# Patient Record
Sex: Female | Born: 1945 | Race: White | Hispanic: No | Marital: Married
Health system: Southern US, Community
[De-identification: ages and names within clinical notes are randomized; demographics above are authoritative.]

---

## 2003-07-16 ENCOUNTER — Other Ambulatory Visit: Payer: Self-pay

## 2003-09-27 ENCOUNTER — Other Ambulatory Visit: Payer: Self-pay

## 2004-08-11 ENCOUNTER — Ambulatory Visit: Payer: Self-pay

## 2004-11-24 ENCOUNTER — Ambulatory Visit: Payer: Self-pay | Admitting: Pain Medicine

## 2004-12-18 ENCOUNTER — Ambulatory Visit: Payer: Self-pay | Admitting: Internal Medicine

## 2005-01-16 ENCOUNTER — Ambulatory Visit: Payer: Self-pay | Admitting: Pain Medicine

## 2005-01-17 ENCOUNTER — Ambulatory Visit: Payer: Self-pay | Admitting: Internal Medicine

## 2005-03-07 ENCOUNTER — Ambulatory Visit: Payer: Self-pay | Admitting: Internal Medicine

## 2005-05-19 ENCOUNTER — Other Ambulatory Visit: Payer: Self-pay

## 2005-05-19 ENCOUNTER — Inpatient Hospital Stay: Payer: Self-pay | Admitting: Internal Medicine

## 2005-05-20 ENCOUNTER — Other Ambulatory Visit: Payer: Self-pay

## 2005-06-02 ENCOUNTER — Ambulatory Visit: Payer: Self-pay | Admitting: Internal Medicine

## 2005-09-16 ENCOUNTER — Inpatient Hospital Stay: Payer: Self-pay | Admitting: Internal Medicine

## 2005-09-21 ENCOUNTER — Emergency Department: Payer: Self-pay | Admitting: Emergency Medicine

## 2005-09-24 ENCOUNTER — Other Ambulatory Visit: Payer: Self-pay

## 2005-09-24 ENCOUNTER — Emergency Department: Payer: Self-pay | Admitting: Emergency Medicine

## 2006-03-01 ENCOUNTER — Inpatient Hospital Stay: Payer: Self-pay | Admitting: Internal Medicine

## 2006-04-23 ENCOUNTER — Ambulatory Visit: Payer: Self-pay | Admitting: Specialist

## 2006-06-01 ENCOUNTER — Ambulatory Visit: Payer: Self-pay | Admitting: Specialist

## 2006-08-25 ENCOUNTER — Inpatient Hospital Stay: Payer: Self-pay | Admitting: Internal Medicine

## 2006-08-25 ENCOUNTER — Other Ambulatory Visit: Payer: Self-pay

## 2006-10-13 ENCOUNTER — Other Ambulatory Visit: Payer: Self-pay

## 2006-10-13 ENCOUNTER — Emergency Department: Payer: Self-pay | Admitting: Emergency Medicine

## 2007-01-16 ENCOUNTER — Emergency Department: Payer: Self-pay | Admitting: Emergency Medicine

## 2007-02-24 ENCOUNTER — Other Ambulatory Visit: Payer: Self-pay

## 2007-02-24 ENCOUNTER — Emergency Department: Payer: Self-pay | Admitting: Emergency Medicine

## 2007-07-23 ENCOUNTER — Ambulatory Visit: Payer: Self-pay

## 2007-07-23 ENCOUNTER — Ambulatory Visit: Payer: Self-pay | Admitting: Specialist

## 2008-09-24 ENCOUNTER — Ambulatory Visit: Payer: Self-pay | Admitting: Specialist

## 2008-10-12 ENCOUNTER — Inpatient Hospital Stay: Payer: Self-pay | Admitting: Internal Medicine

## 2008-11-12 ENCOUNTER — Emergency Department: Payer: Self-pay | Admitting: Emergency Medicine

## 2009-02-23 ENCOUNTER — Ambulatory Visit: Payer: Self-pay | Admitting: Internal Medicine

## 2009-05-19 ENCOUNTER — Inpatient Hospital Stay: Payer: Self-pay | Admitting: Internal Medicine

## 2009-05-23 ENCOUNTER — Encounter: Payer: Self-pay | Admitting: Internal Medicine

## 2009-06-09 ENCOUNTER — Encounter: Payer: Self-pay | Admitting: Internal Medicine

## 2009-07-09 ENCOUNTER — Encounter: Payer: Self-pay | Admitting: Internal Medicine

## 2009-10-24 ENCOUNTER — Inpatient Hospital Stay: Payer: Self-pay | Admitting: Internal Medicine

## 2010-04-28 ENCOUNTER — Ambulatory Visit: Payer: Self-pay | Admitting: Internal Medicine

## 2010-05-16 ENCOUNTER — Ambulatory Visit: Payer: Self-pay | Admitting: Internal Medicine

## 2010-08-16 ENCOUNTER — Ambulatory Visit: Payer: Self-pay | Admitting: Gastroenterology

## 2010-08-17 ENCOUNTER — Ambulatory Visit: Payer: Self-pay | Admitting: Unknown Physician Specialty

## 2011-04-21 ENCOUNTER — Ambulatory Visit: Payer: Self-pay | Admitting: Internal Medicine

## 2011-11-01 ENCOUNTER — Ambulatory Visit: Payer: Self-pay | Admitting: Internal Medicine

## 2011-11-21 ENCOUNTER — Observation Stay: Payer: Self-pay | Admitting: Internal Medicine

## 2011-11-21 LAB — COMPREHENSIVE METABOLIC PANEL
Albumin: 3.2 g/dL — ABNORMAL LOW (ref 3.4–5.0)
Alkaline Phosphatase: 114 U/L (ref 50–136)
Anion Gap: 9 (ref 7–16)
BUN: 18 mg/dL (ref 7–18)
Bilirubin,Total: 0.7 mg/dL (ref 0.2–1.0)
Calcium, Total: 8.5 mg/dL (ref 8.5–10.1)
Creatinine: 0.8 mg/dL (ref 0.60–1.30)
Glucose: 102 mg/dL — ABNORMAL HIGH (ref 65–99)
Osmolality: 280 (ref 275–301)
SGPT (ALT): 10 U/L — ABNORMAL LOW (ref 12–78)

## 2011-11-21 LAB — CBC
HCT: 36.2 % (ref 35.0–47.0)
MCV: 94 fL (ref 80–100)
RBC: 3.85 10*6/uL (ref 3.80–5.20)
RDW: 13.4 % (ref 11.5–14.5)
WBC: 9.7 10*3/uL (ref 3.6–11.0)

## 2011-11-21 LAB — CK TOTAL AND CKMB (NOT AT ARMC): CK, Total: 39 U/L (ref 21–215)

## 2011-11-21 LAB — PROTIME-INR: Prothrombin Time: 13.3 secs (ref 11.5–14.7)

## 2011-11-21 LAB — TROPONIN I: Troponin-I: <0.02 ng/mL

## 2011-11-22 LAB — BASIC METABOLIC PANEL
Anion Gap: 10 (ref 7–16)
BUN: 18 mg/dL (ref 7–18)
Calcium, Total: 9.2 mg/dL (ref 8.5–10.1)
EGFR (Non-African Amer.): >60
Glucose: 133 mg/dL — ABNORMAL HIGH (ref 65–99)
Osmolality: 281 (ref 275–301)
Sodium: 139 mmol/L (ref 136–145)

## 2011-11-22 LAB — CBC WITH DIFFERENTIAL/PLATELET
Basophil %: 0.2 %
Eosinophil %: 0 %
HCT: 34.8 % — ABNORMAL LOW (ref 35.0–47.0)
Lymphocyte #: 0.4 10*3/uL — ABNORMAL LOW (ref 1.0–3.6)
MCH: 32.6 pg (ref 26.0–34.0)
MCHC: 34.8 g/dL (ref 32.0–36.0)
MCV: 94 fL (ref 80–100)
Monocyte #: 0.1 x10 3/mm — ABNORMAL LOW (ref 0.2–0.9)
Monocyte %: 1.4 %
Neutrophil %: 89.5 %
Platelet: 163 10*3/uL (ref 150–440)
RBC: 3.71 10*6/uL — ABNORMAL LOW (ref 3.80–5.20)
WBC: 4.2 10*3/uL (ref 3.6–11.0)

## 2011-11-22 LAB — PROTIME-INR: INR: 0.9

## 2011-11-22 LAB — TROPONIN I: Troponin-I: <0.02 ng/mL

## 2011-11-24 ENCOUNTER — Ambulatory Visit: Payer: Self-pay | Admitting: Internal Medicine

## 2011-12-10 ENCOUNTER — Ambulatory Visit: Payer: Self-pay | Admitting: Internal Medicine

## 2011-12-22 ENCOUNTER — Emergency Department: Payer: Self-pay | Admitting: Emergency Medicine

## 2012-01-05 ENCOUNTER — Inpatient Hospital Stay: Payer: Self-pay | Admitting: Internal Medicine

## 2012-01-05 LAB — PROTIME-INR
INR: 6
Prothrombin Time: 53.1 secs — ABNORMAL HIGH (ref 11.5–14.7)

## 2012-01-05 LAB — COMPREHENSIVE METABOLIC PANEL
Anion Gap: 8 (ref 7–16)
BUN: 30 mg/dL — ABNORMAL HIGH (ref 7–18)
Bilirubin,Total: 0.5 mg/dL (ref 0.2–1.0)
Chloride: 105 mmol/L (ref 98–107)
Creatinine: 1.04 mg/dL (ref 0.60–1.30)
EGFR (African American): >60
Osmolality: 286 (ref 275–301)
Potassium: 4.6 mmol/L (ref 3.5–5.1)
SGOT(AST): 18 U/L (ref 15–37)
SGPT (ALT): <6 U/L — ABNORMAL LOW (ref 12–78)
Sodium: 140 mmol/L (ref 136–145)
Total Protein: 6.2 g/dL — ABNORMAL LOW (ref 6.4–8.2)

## 2012-01-05 LAB — TROPONIN I
Troponin-I: 0.13 ng/mL — ABNORMAL HIGH
Troponin-I: 0.08 ng/mL — ABNORMAL HIGH

## 2012-01-05 LAB — CK TOTAL AND CKMB (NOT AT ARMC)
CK, Total: 165 U/L (ref 21–215)
CK, Total: 134 U/L (ref 21–215)

## 2012-01-05 LAB — CBC
HGB: 9.9 g/dL — ABNORMAL LOW (ref 12.0–16.0)
MCHC: 33.2 g/dL (ref 32.0–36.0)

## 2012-01-06 LAB — CBC WITH DIFFERENTIAL/PLATELET
Basophil %: 0.1 %
Eosinophil #: 0 10*3/uL (ref 0.0–0.7)
Eosinophil %: 0 %
HCT: 26.3 % — ABNORMAL LOW (ref 35.0–47.0)
Lymphocyte #: 0.3 10*3/uL — ABNORMAL LOW (ref 1.0–3.6)
MCHC: 31.3 g/dL — ABNORMAL LOW (ref 32.0–36.0)
MCV: 95 fL (ref 80–100)
Monocyte #: 0.1 x10 3/mm — ABNORMAL LOW (ref 0.2–0.9)
RDW: 14.7 % — ABNORMAL HIGH (ref 11.5–14.5)

## 2012-01-06 LAB — COMPREHENSIVE METABOLIC PANEL
Albumin: 2.3 g/dL — ABNORMAL LOW (ref 3.4–5.0)
Anion Gap: 7 (ref 7–16)
BUN: 21 mg/dL — ABNORMAL HIGH (ref 7–18)
Calcium, Total: 8.5 mg/dL (ref 8.5–10.1)
EGFR (African American): >60
EGFR (Non-African Amer.): >60
Glucose: 189 mg/dL — ABNORMAL HIGH (ref 65–99)
Osmolality: 289 (ref 275–301)
Potassium: 4.8 mmol/L (ref 3.5–5.1)
SGOT(AST): 11 U/L — ABNORMAL LOW (ref 15–37)
SGPT (ALT): 7 U/L — ABNORMAL LOW (ref 12–78)

## 2012-01-06 LAB — PROTIME-INR: Prothrombin Time: 37 secs — ABNORMAL HIGH (ref 11.5–14.7)

## 2012-01-07 LAB — COMPREHENSIVE METABOLIC PANEL
Albumin: 2.4 g/dL — ABNORMAL LOW (ref 3.4–5.0)
Alkaline Phosphatase: 83 U/L (ref 50–136)
BUN: 27 mg/dL — ABNORMAL HIGH (ref 7–18)
Calcium, Total: 8 mg/dL — ABNORMAL LOW (ref 8.5–10.1)
Chloride: 109 mmol/L — ABNORMAL HIGH (ref 98–107)
Co2: 27 mmol/L (ref 21–32)
EGFR (African American): >60
EGFR (Non-African Amer.): >60
Glucose: 135 mg/dL — ABNORMAL HIGH (ref 65–99)
Osmolality: 292 (ref 275–301)
Potassium: 4.8 mmol/L (ref 3.5–5.1)
SGOT(AST): 15 U/L (ref 15–37)
Sodium: 143 mmol/L (ref 136–145)
Total Protein: 5.2 g/dL — ABNORMAL LOW (ref 6.4–8.2)

## 2012-01-07 LAB — CBC WITH DIFFERENTIAL/PLATELET
Basophil #: 0 10*3/uL (ref 0.0–0.1)
Basophil %: 0 %
Eosinophil #: 0 10*3/uL (ref 0.0–0.7)
HCT: 26.5 % — ABNORMAL LOW (ref 35.0–47.0)
Lymphocyte #: 0.4 10*3/uL — ABNORMAL LOW (ref 1.0–3.6)
MCH: 31 pg (ref 26.0–34.0)
MCHC: 33 g/dL (ref 32.0–36.0)
MCV: 94 fL (ref 80–100)
Monocyte #: 0.2 x10 3/mm (ref 0.2–0.9)
Neutrophil #: 4.9 10*3/uL (ref 1.4–6.5)
RDW: 14.8 % — ABNORMAL HIGH (ref 11.5–14.5)

## 2012-01-07 LAB — PROTIME-INR
INR: 2.5
Prothrombin Time: 27 secs — ABNORMAL HIGH (ref 11.5–14.7)

## 2012-01-08 LAB — BASIC METABOLIC PANEL
Calcium, Total: 7.8 mg/dL — ABNORMAL LOW (ref 8.5–10.1)
Chloride: 107 mmol/L (ref 98–107)
Co2: 28 mmol/L (ref 21–32)
Creatinine: 0.73 mg/dL (ref 0.60–1.30)
EGFR (African American): >60
Potassium: 4.7 mmol/L (ref 3.5–5.1)
Sodium: 140 mmol/L (ref 136–145)

## 2012-01-08 LAB — CBC WITH DIFFERENTIAL/PLATELET
Basophil %: 0.3 %
Eosinophil %: 0.1 %
HGB: 9.4 g/dL — ABNORMAL LOW (ref 12.0–16.0)
Lymphocyte #: 0.4 10*3/uL — ABNORMAL LOW (ref 1.0–3.6)
Lymphocyte %: 7.2 %
MCV: 94 fL (ref 80–100)
Monocyte #: 0.1 x10 3/mm — ABNORMAL LOW (ref 0.2–0.9)
Monocyte %: 2.7 %
Neutrophil #: 4.6 10*3/uL (ref 1.4–6.5)
Platelet: 158 10*3/uL (ref 150–440)
RBC: 2.94 10*6/uL — ABNORMAL LOW (ref 3.80–5.20)
RDW: 14.5 % (ref 11.5–14.5)
WBC: 5.1 10*3/uL (ref 3.6–11.0)

## 2012-01-08 LAB — PROTIME-INR
INR: 2
Prothrombin Time: 23.3 secs — ABNORMAL HIGH (ref 11.5–14.7)

## 2012-01-09 LAB — PROTIME-INR
INR: 2.4
Prothrombin Time: 26.6 secs — ABNORMAL HIGH (ref 11.5–14.7)

## 2012-01-09 LAB — BASIC METABOLIC PANEL
Calcium, Total: 7.6 mg/dL — ABNORMAL LOW (ref 8.5–10.1)
Chloride: 109 mmol/L — ABNORMAL HIGH (ref 98–107)
Osmolality: 285 (ref 275–301)
Potassium: 4.5 mmol/L (ref 3.5–5.1)

## 2012-01-10 LAB — PROTIME-INR
INR: 2.4
Prothrombin Time: 26.3 s — ABNORMAL HIGH (ref 11.5–14.7)

## 2012-01-11 LAB — PROTIME-INR
INR: 2.4
Prothrombin Time: 26.5 secs — ABNORMAL HIGH (ref 11.5–14.7)

## 2012-01-11 LAB — CBC WITH DIFFERENTIAL/PLATELET
HCT: 25.2 % — ABNORMAL LOW (ref 35.0–47.0)
HGB: 8.6 g/dL — ABNORMAL LOW (ref 12.0–16.0)
Lymphocytes: 17 %
MCH: 31.5 pg (ref 26.0–34.0)
MCHC: 34.2 g/dL (ref 32.0–36.0)
MCV: 92 fL (ref 80–100)
Segmented Neutrophils: 2 %

## 2012-01-11 LAB — CULTURE, BLOOD (SINGLE)

## 2012-04-17 ENCOUNTER — Ambulatory Visit: Payer: Self-pay | Admitting: Specialist

## 2012-05-20 ENCOUNTER — Ambulatory Visit: Payer: Self-pay | Admitting: Gastroenterology

## 2012-07-17 LAB — PROTIME-INR
INR: 2.6
Prothrombin Time: 27 secs — ABNORMAL HIGH (ref 11.5–14.7)

## 2012-07-17 LAB — URINALYSIS, COMPLETE
Bilirubin,UR: NEGATIVE
Glucose,UR: NEGATIVE mg/dL (ref 0–75)
Nitrite: POSITIVE
Ph: 5 (ref 4.5–8.0)
Protein: NEGATIVE
RBC,UR: 89 /HPF (ref 0–5)
Specific Gravity: 1.02 (ref 1.003–1.030)
Squamous Epithelial: <1
WBC UR: 35 /HPF (ref 0–5)

## 2012-07-17 LAB — TROPONIN I: Troponin-I: <0.02 ng/mL

## 2012-07-17 LAB — CBC
HCT: 33.3 % — ABNORMAL LOW (ref 35.0–47.0)
HGB: 11.3 g/dL — ABNORMAL LOW (ref 12.0–16.0)
RBC: 3.4 10*6/uL — ABNORMAL LOW (ref 3.80–5.20)
RDW: 15.1 % — ABNORMAL HIGH (ref 11.5–14.5)
WBC: 5.1 10*3/uL (ref 3.6–11.0)

## 2012-07-17 LAB — COMPREHENSIVE METABOLIC PANEL
Albumin: 2.6 g/dL — ABNORMAL LOW (ref 3.4–5.0)
Alkaline Phosphatase: 82 U/L (ref 50–136)
Calcium, Total: 7.8 mg/dL — ABNORMAL LOW (ref 8.5–10.1)
Creatinine: 1.28 mg/dL (ref 0.60–1.30)
EGFR (African American): 50 — ABNORMAL LOW
EGFR (Non-African Amer.): 44 — ABNORMAL LOW
Glucose: 89 mg/dL (ref 65–99)
SGOT(AST): 22 U/L (ref 15–37)
SGPT (ALT): <6 U/L — ABNORMAL LOW (ref 12–78)
Sodium: 137 mmol/L (ref 136–145)
Total Protein: 5.3 g/dL — ABNORMAL LOW (ref 6.4–8.2)

## 2012-07-17 LAB — CK TOTAL AND CKMB (NOT AT ARMC): CK-MB: 1 ng/mL (ref 0.5–3.6)

## 2012-07-17 LAB — APTT: Activated PTT: 35.4 secs (ref 23.6–35.9)

## 2012-07-18 ENCOUNTER — Observation Stay: Payer: Self-pay | Admitting: Internal Medicine

## 2012-07-18 LAB — CK TOTAL AND CKMB (NOT AT ARMC)
CK, Total: 37 U/L (ref 21–215)
CK, Total: 43 U/L (ref 21–215)
CK-MB: 0.7 ng/mL (ref 0.5–3.6)

## 2012-07-18 LAB — TROPONIN I
Troponin-I: <0.02 ng/mL
Troponin-I: <0.02 ng/mL

## 2012-07-19 LAB — CBC WITH DIFFERENTIAL/PLATELET
Basophil %: 0.6 %
Eosinophil #: 0 10*3/uL (ref 0.0–0.7)
Eosinophil %: 1.6 %
HCT: 29.6 % — ABNORMAL LOW (ref 35.0–47.0)
HGB: 10.1 g/dL — ABNORMAL LOW (ref 12.0–16.0)
Lymphocyte #: 0.9 10*3/uL — ABNORMAL LOW (ref 1.0–3.6)
MCH: 33.4 pg (ref 26.0–34.0)
MCHC: 34.1 g/dL (ref 32.0–36.0)
Monocyte #: 0.3 x10 3/mm (ref 0.2–0.9)
Monocyte %: 10.6 %
Neutrophil #: 1.6 10*3/uL (ref 1.4–6.5)
Platelet: 124 10*3/uL — ABNORMAL LOW (ref 150–440)
RDW: 15.3 % — ABNORMAL HIGH (ref 11.5–14.5)
WBC: 2.8 10*3/uL — ABNORMAL LOW (ref 3.6–11.0)

## 2012-07-19 LAB — BASIC METABOLIC PANEL
Anion Gap: 3 — ABNORMAL LOW (ref 7–16)
Calcium, Total: 8 mg/dL — ABNORMAL LOW (ref 8.5–10.1)
Creatinine: 0.9 mg/dL (ref 0.60–1.30)
EGFR (African American): >60
Glucose: 96 mg/dL (ref 65–99)
Osmolality: 285 (ref 275–301)
Potassium: 4 mmol/L (ref 3.5–5.1)
Sodium: 142 mmol/L (ref 136–145)

## 2012-07-19 LAB — PROTIME-INR
INR: 2.4
Prothrombin Time: 25.7 secs — ABNORMAL HIGH (ref 11.5–14.7)

## 2012-08-26 ENCOUNTER — Other Ambulatory Visit: Payer: Self-pay | Admitting: Gastroenterology

## 2012-09-02 ENCOUNTER — Encounter: Payer: Self-pay | Admitting: Neurology

## 2012-09-16 ENCOUNTER — Encounter: Payer: Self-pay | Admitting: Neurology

## 2012-09-20 ENCOUNTER — Other Ambulatory Visit: Payer: Self-pay | Admitting: Gastroenterology

## 2012-09-20 LAB — CLOSTRIDIUM DIFFICILE BY PCR

## 2012-10-02 ENCOUNTER — Inpatient Hospital Stay: Payer: Self-pay | Admitting: Internal Medicine

## 2012-10-02 LAB — COMPREHENSIVE METABOLIC PANEL
BUN: 19 mg/dL — ABNORMAL HIGH (ref 7–18)
Calcium, Total: 8.6 mg/dL (ref 8.5–10.1)
Co2: 31 mmol/L (ref 21–32)
Creatinine: 1.08 mg/dL (ref 0.60–1.30)
Sodium: 137 mmol/L (ref 136–145)
Total Protein: 6 g/dL — ABNORMAL LOW (ref 6.4–8.2)

## 2012-10-02 LAB — URINALYSIS, COMPLETE
Bilirubin,UR: NEGATIVE
Blood: NEGATIVE
Leukocyte Esterase: NEGATIVE
Nitrite: NEGATIVE
Protein: NEGATIVE
RBC,UR: <1 /HPF (ref 0–5)
Specific Gravity: 1.004 (ref 1.003–1.030)
WBC UR: 3 /HPF (ref 0–5)

## 2012-10-02 LAB — CBC
HCT: 35.6 % (ref 35.0–47.0)
MCH: 32.8 pg (ref 26.0–34.0)
MCHC: 34 g/dL (ref 32.0–36.0)
RBC: 3.69 10*6/uL — ABNORMAL LOW (ref 3.80–5.20)
WBC: 7.4 10*3/uL (ref 3.6–11.0)

## 2012-10-02 LAB — PROTIME-INR
INR: 2
Prothrombin Time: 22.1 secs — ABNORMAL HIGH (ref 11.5–14.7)

## 2012-10-03 LAB — BASIC METABOLIC PANEL
Anion Gap: 5 — ABNORMAL LOW (ref 7–16)
BUN: 19 mg/dL — ABNORMAL HIGH (ref 7–18)
Chloride: 105 mmol/L (ref 98–107)
Creatinine: 1.14 mg/dL (ref 0.60–1.30)
EGFR (African American): 58 — ABNORMAL LOW
EGFR (Non-African Amer.): 50 — ABNORMAL LOW
Glucose: 78 mg/dL (ref 65–99)
Osmolality: 279 (ref 275–301)
Sodium: 139 mmol/L (ref 136–145)

## 2012-10-03 LAB — CBC WITH DIFFERENTIAL/PLATELET
Basophil #: 0 10*3/uL (ref 0.0–0.1)
Eosinophil #: 0 10*3/uL (ref 0.0–0.7)
HCT: 26.5 % — ABNORMAL LOW (ref 35.0–47.0)
Lymphocyte #: 1 10*3/uL (ref 1.0–3.6)
Lymphocyte %: 26.4 %
MCV: 97 fL (ref 80–100)
Monocyte %: 5.4 %
Neutrophil #: 2.6 10*3/uL (ref 1.4–6.5)
Neutrophil %: 66.8 %
Platelet: 134 10*3/uL — ABNORMAL LOW (ref 150–440)
RBC: 2.73 10*6/uL — ABNORMAL LOW (ref 3.80–5.20)
WBC: 3.9 10*3/uL (ref 3.6–11.0)

## 2012-10-03 LAB — PROTIME-INR
INR: 2
Prothrombin Time: 22.3 secs — ABNORMAL HIGH (ref 11.5–14.7)

## 2012-10-03 LAB — FERRITIN: Ferritin (ARMC): 209 ng/mL (ref 8–388)

## 2012-10-03 LAB — IRON AND TIBC
Iron Bind.Cap.(Total): 136 ug/dL — ABNORMAL LOW (ref 250–450)
Iron Saturation: 46 %
Iron: 62 ug/dL (ref 50–170)

## 2012-10-04 LAB — COMPREHENSIVE METABOLIC PANEL
Albumin: 2.2 g/dL — ABNORMAL LOW (ref 3.4–5.0)
BUN: 17 mg/dL (ref 7–18)
Bilirubin,Total: 0.4 mg/dL (ref 0.2–1.0)
Chloride: 106 mmol/L (ref 98–107)
Co2: 31 mmol/L (ref 21–32)
EGFR (African American): >60
Glucose: 79 mg/dL (ref 65–99)
Osmolality: 280 (ref 275–301)
SGOT(AST): 16 U/L (ref 15–37)
Total Protein: 4.7 g/dL — ABNORMAL LOW (ref 6.4–8.2)

## 2012-10-04 LAB — CBC WITH DIFFERENTIAL/PLATELET
Basophil #: 0 10*3/uL (ref 0.0–0.1)
Eosinophil #: 0 10*3/uL (ref 0.0–0.7)
HCT: 28.4 % — ABNORMAL LOW (ref 35.0–47.0)
Lymphocyte #: 0.7 10*3/uL — ABNORMAL LOW (ref 1.0–3.6)
MCH: 33.4 pg (ref 26.0–34.0)
MCV: 98 fL (ref 80–100)
Monocyte #: 0.3 x10 3/mm (ref 0.2–0.9)
Monocyte %: 6 %
Neutrophil #: 4.7 10*3/uL (ref 1.4–6.5)
Neutrophil %: 81.7 %
Platelet: 137 10*3/uL — ABNORMAL LOW (ref 150–440)
RBC: 2.91 10*6/uL — ABNORMAL LOW (ref 3.80–5.20)

## 2012-10-04 LAB — PROTIME-INR
INR: 2.4
Prothrombin Time: 25.6 secs — ABNORMAL HIGH (ref 11.5–14.7)

## 2012-10-09 ENCOUNTER — Encounter: Payer: Self-pay | Admitting: Neurology

## 2012-11-07 ENCOUNTER — Ambulatory Visit: Payer: Self-pay | Admitting: Specialist

## 2012-11-21 ENCOUNTER — Ambulatory Visit: Payer: Self-pay | Admitting: Hospice and Palliative Medicine

## 2012-11-21 ENCOUNTER — Inpatient Hospital Stay: Payer: Self-pay | Admitting: Internal Medicine

## 2012-11-21 LAB — PROTIME-INR
INR: 1.4
Prothrombin Time: 17.1 secs — ABNORMAL HIGH (ref 11.5–14.7)

## 2012-11-21 LAB — BASIC METABOLIC PANEL
Anion Gap: 7 (ref 7–16)
BUN: 18 mg/dL (ref 7–18)
Chloride: 105 mmol/L (ref 98–107)
Co2: 25 mmol/L (ref 21–32)
EGFR (African American): >60
EGFR (Non-African Amer.): >60
Glucose: 97 mg/dL (ref 65–99)
Osmolality: 276 (ref 275–301)
Sodium: 137 mmol/L (ref 136–145)

## 2012-11-21 LAB — CK TOTAL AND CKMB (NOT AT ARMC)
CK, Total: 67 U/L (ref 21–215)
CK-MB: <0.5 ng/mL — ABNORMAL LOW (ref 0.5–3.6)

## 2012-11-21 LAB — TROPONIN I: Troponin-I: <0.02 ng/mL

## 2012-11-21 LAB — CBC
HGB: 9.6 g/dL — ABNORMAL LOW (ref 12.0–16.0)
MCH: 35.1 pg — ABNORMAL HIGH (ref 26.0–34.0)
MCV: 104 fL — ABNORMAL HIGH (ref 80–100)
Platelet: 184 10*3/uL (ref 150–440)
RDW: 14.9 % — ABNORMAL HIGH (ref 11.5–14.5)

## 2012-11-22 LAB — BASIC METABOLIC PANEL
BUN: 19 mg/dL — ABNORMAL HIGH (ref 7–18)
Calcium, Total: 8.3 mg/dL — ABNORMAL LOW (ref 8.5–10.1)
Chloride: 107 mmol/L (ref 98–107)
Co2: 25 mmol/L (ref 21–32)
Creatinine: 0.73 mg/dL (ref 0.60–1.30)
Osmolality: 279 (ref 275–301)
Sodium: 137 mmol/L (ref 136–145)

## 2012-11-22 LAB — CBC WITH DIFFERENTIAL/PLATELET
Basophil #: 0 10*3/uL (ref 0.0–0.1)
Basophil %: 0.1 %
Eosinophil #: 0 10*3/uL (ref 0.0–0.7)
HGB: 9 g/dL — ABNORMAL LOW (ref 12.0–16.0)
Lymphocyte %: 9.8 %
Monocyte %: 1.3 %
Neutrophil #: 3.6 10*3/uL (ref 1.4–6.5)
Platelet: 178 10*3/uL (ref 150–440)
RBC: 2.54 10*6/uL — ABNORMAL LOW (ref 3.80–5.20)
RDW: 14.3 % (ref 11.5–14.5)
WBC: 4.1 10*3/uL (ref 3.6–11.0)

## 2012-11-22 LAB — PROTIME-INR: INR: 1.9

## 2012-11-23 LAB — CBC WITH DIFFERENTIAL/PLATELET
Basophil #: 0 10*3/uL (ref 0.0–0.1)
Eosinophil #: 0 10*3/uL (ref 0.0–0.7)
Eosinophil %: 0.1 %
HGB: 9.7 g/dL — ABNORMAL LOW (ref 12.0–16.0)
Lymphocyte %: 9.8 %
MCH: 35.5 pg — ABNORMAL HIGH (ref 26.0–34.0)
MCV: 106 fL — ABNORMAL HIGH (ref 80–100)
Monocyte #: 0.1 x10 3/mm — ABNORMAL LOW (ref 0.2–0.9)
Neutrophil %: 88.3 %
Platelet: 227 10*3/uL (ref 150–440)
WBC: 5.8 10*3/uL (ref 3.6–11.0)

## 2012-11-23 LAB — BASIC METABOLIC PANEL
Anion Gap: 4 — ABNORMAL LOW (ref 7–16)
BUN: 21 mg/dL — ABNORMAL HIGH (ref 7–18)
Calcium, Total: 8.5 mg/dL (ref 8.5–10.1)
Chloride: 108 mmol/L — ABNORMAL HIGH (ref 98–107)
Co2: 27 mmol/L (ref 21–32)
Creatinine: 0.99 mg/dL (ref 0.60–1.30)
EGFR (African American): >60
EGFR (Non-African Amer.): 59 — ABNORMAL LOW
Glucose: 117 mg/dL — ABNORMAL HIGH (ref 65–99)
Osmolality: 282 (ref 275–301)
Sodium: 139 mmol/L (ref 136–145)

## 2012-11-23 LAB — PROTIME-INR: Prothrombin Time: 21.2 secs — ABNORMAL HIGH (ref 11.5–14.7)

## 2012-11-24 LAB — PROTIME-INR: INR: 2.2

## 2012-11-25 LAB — PROTIME-INR: Prothrombin Time: 27.5 secs — ABNORMAL HIGH (ref 11.5–14.7)

## 2012-11-26 LAB — CULTURE, BLOOD (SINGLE)

## 2012-12-09 ENCOUNTER — Ambulatory Visit: Payer: Self-pay | Admitting: Hospice and Palliative Medicine

## 2013-02-22 ENCOUNTER — Inpatient Hospital Stay: Payer: Self-pay | Admitting: Internal Medicine

## 2013-02-22 LAB — COMPREHENSIVE METABOLIC PANEL WITH GFR
Albumin: 1.9 g/dL — ABNORMAL LOW
Alkaline Phosphatase: 74 U/L
Anion Gap: 5 — ABNORMAL LOW
BUN: 16 mg/dL
Bilirubin,Total: 0.8 mg/dL
Calcium, Total: 7.9 mg/dL — ABNORMAL LOW
Chloride: 108 mmol/L — ABNORMAL HIGH
Co2: 28 mmol/L
Creatinine: 0.79 mg/dL
EGFR (African American): >60
EGFR (Non-African Amer.): >60
Glucose: 84 mg/dL
Osmolality: 282
Potassium: 4.2 mmol/L
SGOT(AST): 23 U/L
SGPT (ALT): <6 U/L — ABNORMAL LOW
Sodium: 141 mmol/L
Total Protein: 4.4 g/dL — ABNORMAL LOW

## 2013-02-22 LAB — URINALYSIS, COMPLETE
Bilirubin,UR: NEGATIVE
GLUCOSE, UR: NEGATIVE mg/dL (ref 0–75)
Leukocyte Esterase: NEGATIVE
Nitrite: NEGATIVE
Ph: 5 (ref 4.5–8.0)
Protein: 30
RBC,UR: 81 /HPF (ref 0–5)
Specific Gravity: 1.027 (ref 1.003–1.030)
Squamous Epithelial: <1
WBC UR: 8 /HPF (ref 0–5)

## 2013-02-22 LAB — CBC
HCT: 25.4 % — ABNORMAL LOW
HGB: 8.7 g/dL — ABNORMAL LOW
MCH: 37.6 pg — ABNORMAL HIGH
MCHC: 34.2 g/dL
MCV: 110 fL — ABNORMAL HIGH
Platelet: 172 x10 3/mm 3
RBC: 2.31 X10 6/mm 3 — ABNORMAL LOW
RDW: 15.1 % — ABNORMAL HIGH
WBC: 3.7 x10 3/mm 3

## 2013-02-22 LAB — PROTIME-INR
INR: 4.8
Prothrombin Time: 43.3 s — ABNORMAL HIGH

## 2013-02-22 LAB — TROPONIN I: Troponin-I: <0.02 ng/mL

## 2013-02-23 LAB — CBC WITH DIFFERENTIAL/PLATELET
BASOS PCT: 0.6 %
Basophil #: 0 10*3/uL (ref 0.0–0.1)
EOS ABS: 0 10*3/uL (ref 0.0–0.7)
EOS PCT: 1.3 %
HCT: 23.2 % — AB (ref 35.0–47.0)
HGB: 7.8 g/dL — ABNORMAL LOW (ref 12.0–16.0)
Lymphocyte #: 0.7 10*3/uL — ABNORMAL LOW (ref 1.0–3.6)
Lymphocyte %: 32 %
MCH: 36.9 pg — ABNORMAL HIGH (ref 26.0–34.0)
MCHC: 33.6 g/dL (ref 32.0–36.0)
MCV: 110 fL — AB (ref 80–100)
Monocyte #: 0.1 x10 3/mm — ABNORMAL LOW (ref 0.2–0.9)
Monocyte %: 6.2 %
NEUTROS ABS: 1.4 10*3/uL (ref 1.4–6.5)
Neutrophil %: 59.9 %
Platelet: 154 10*3/uL (ref 150–440)
RBC: 2.12 10*6/uL — AB (ref 3.80–5.20)
RDW: 15.1 % — ABNORMAL HIGH (ref 11.5–14.5)
WBC: 2.3 10*3/uL — ABNORMAL LOW (ref 3.6–11.0)

## 2013-02-23 LAB — BASIC METABOLIC PANEL
Anion Gap: 5 — ABNORMAL LOW (ref 7–16)
BUN: 12 mg/dL (ref 7–18)
CALCIUM: 7.2 mg/dL — AB (ref 8.5–10.1)
CO2: 27 mmol/L (ref 21–32)
Chloride: 110 mmol/L — ABNORMAL HIGH (ref 98–107)
Creatinine: 0.74 mg/dL (ref 0.60–1.30)
EGFR (African American): >60
EGFR (Non-African Amer.): >60
Glucose: 72 mg/dL (ref 65–99)
OSMOLALITY: 281 (ref 275–301)
Potassium: 3.6 mmol/L (ref 3.5–5.1)
SODIUM: 142 mmol/L (ref 136–145)

## 2013-02-23 LAB — PROTIME-INR
INR: 3.9
Prothrombin Time: 37.1 secs — ABNORMAL HIGH (ref 11.5–14.7)

## 2013-02-24 ENCOUNTER — Ambulatory Visit: Payer: Self-pay | Admitting: Oncology

## 2013-02-24 LAB — IRON AND TIBC
Iron Bind.Cap.(Total): 142 ug/dL — ABNORMAL LOW (ref 250–450)
Iron Saturation: 32 %
Iron: 45 ug/dL — ABNORMAL LOW (ref 50–170)
Unbound Iron-Bind.Cap.: 97 ug/dL

## 2013-02-24 LAB — CBC WITH DIFFERENTIAL/PLATELET
BASOS ABS: 0 10*3/uL (ref 0.0–0.1)
Basophil %: 0.8 %
EOS ABS: 0 10*3/uL (ref 0.0–0.7)
EOS PCT: 1.4 %
HCT: 24.8 % — AB (ref 35.0–47.0)
HGB: 8.2 g/dL — ABNORMAL LOW (ref 12.0–16.0)
Lymphocyte #: 0.6 10*3/uL — ABNORMAL LOW (ref 1.0–3.6)
Lymphocyte %: 31.4 %
MCH: 36.6 pg — ABNORMAL HIGH (ref 26.0–34.0)
MCHC: 33.1 g/dL (ref 32.0–36.0)
MCV: 111 fL — ABNORMAL HIGH (ref 80–100)
MONOS PCT: 6.6 %
Monocyte #: 0.1 x10 3/mm — ABNORMAL LOW (ref 0.2–0.9)
Neutrophil #: 1.1 10*3/uL — ABNORMAL LOW (ref 1.4–6.5)
Neutrophil %: 59.8 %
PLATELETS: 135 10*3/uL — AB (ref 150–440)
RBC: 2.24 10*6/uL — AB (ref 3.80–5.20)
RDW: 15.4 % — AB (ref 11.5–14.5)
WBC: 1.9 10*3/uL — AB (ref 3.6–11.0)

## 2013-02-24 LAB — URINE CULTURE

## 2013-02-25 LAB — CBC WITH DIFFERENTIAL/PLATELET
Basophil #: 0 10*3/uL (ref 0.0–0.1)
Basophil %: 0.6 %
EOS ABS: 0 10*3/uL (ref 0.0–0.7)
EOS PCT: 1.1 %
HCT: 22.2 % — ABNORMAL LOW (ref 35.0–47.0)
HGB: 7.4 g/dL — AB (ref 12.0–16.0)
LYMPHS PCT: 46.1 %
Lymphocyte #: 0.7 10*3/uL — ABNORMAL LOW (ref 1.0–3.6)
MCH: 37.2 pg — AB (ref 26.0–34.0)
MCHC: 33.4 g/dL (ref 32.0–36.0)
MCV: 111 fL — AB (ref 80–100)
MONO ABS: 0.1 x10 3/mm — AB (ref 0.2–0.9)
MONOS PCT: 6.8 %
NEUTROS ABS: 0.7 10*3/uL — AB (ref 1.4–6.5)
NEUTROS PCT: 45.4 %
PLATELETS: 115 10*3/uL — AB (ref 150–440)
RBC: 2 10*6/uL — AB (ref 3.80–5.20)
RDW: 15.1 % — ABNORMAL HIGH (ref 11.5–14.5)
WBC: 1.5 10*3/uL — CL (ref 3.6–11.0)

## 2013-02-25 LAB — TSH: Thyroid Stimulating Horm: 1.31 u[IU]/mL

## 2013-02-25 LAB — BASIC METABOLIC PANEL
Anion Gap: 4 — ABNORMAL LOW (ref 7–16)
BUN: 9 mg/dL (ref 7–18)
CALCIUM: 7.2 mg/dL — AB (ref 8.5–10.1)
CREATININE: 0.82 mg/dL (ref 0.60–1.30)
Chloride: 112 mmol/L — ABNORMAL HIGH (ref 98–107)
Co2: 26 mmol/L (ref 21–32)
Glucose: 74 mg/dL (ref 65–99)
Osmolality: 280 (ref 275–301)
Potassium: 3.8 mmol/L (ref 3.5–5.1)
SODIUM: 142 mmol/L (ref 136–145)

## 2013-02-25 LAB — FOLATE: FOLIC ACID: 5.2 ng/mL (ref 3.1–100.0)

## 2013-02-25 LAB — LACTATE DEHYDROGENASE: LDH: 150 U/L (ref 81–246)

## 2013-02-26 LAB — PROTIME-INR
INR: 1.5
Prothrombin Time: 17.4 secs — ABNORMAL HIGH (ref 11.5–14.7)

## 2013-02-26 LAB — CBC WITH DIFFERENTIAL/PLATELET
BASOS PCT: 1.2 %
Basophil #: 0 10*3/uL (ref 0.0–0.1)
EOS ABS: 0 10*3/uL (ref 0.0–0.7)
Eosinophil %: 1.1 %
HCT: 32.4 % — AB (ref 35.0–47.0)
HGB: 10.6 g/dL — ABNORMAL LOW (ref 12.0–16.0)
LYMPHS ABS: 0.8 10*3/uL — AB (ref 1.0–3.6)
Lymphocyte %: 33.2 %
MCH: 34 pg (ref 26.0–34.0)
MCHC: 32.7 g/dL (ref 32.0–36.0)
MCV: 104 fL — AB (ref 80–100)
MONOS PCT: 7.2 %
Monocyte #: 0.2 x10 3/mm (ref 0.2–0.9)
Neutrophil #: 1.4 10*3/uL (ref 1.4–6.5)
Neutrophil %: 57.3 %
PLATELETS: 141 10*3/uL — AB (ref 150–440)
RBC: 3.11 10*6/uL — ABNORMAL LOW (ref 3.80–5.20)
RDW: 21.4 % — AB (ref 11.5–14.5)
WBC: 2.4 10*3/uL — AB (ref 3.6–11.0)

## 2013-02-27 LAB — BASIC METABOLIC PANEL
Anion Gap: 3 — ABNORMAL LOW (ref 7–16)
BUN: 7 mg/dL (ref 7–18)
CO2: 25 mmol/L (ref 21–32)
CREATININE: 0.86 mg/dL (ref 0.60–1.30)
Calcium, Total: 8.1 mg/dL — ABNORMAL LOW (ref 8.5–10.1)
Chloride: 115 mmol/L — ABNORMAL HIGH (ref 98–107)
EGFR (African American): >60
GLUCOSE: 70 mg/dL (ref 65–99)
Osmolality: 281 (ref 275–301)
POTASSIUM: 4.1 mmol/L (ref 3.5–5.1)
SODIUM: 143 mmol/L (ref 136–145)

## 2013-02-27 LAB — CBC WITH DIFFERENTIAL/PLATELET
BASOS PCT: 0.5 %
Basophil #: 0 10*3/uL (ref 0.0–0.1)
EOS PCT: 1.1 %
Eosinophil #: 0 10*3/uL (ref 0.0–0.7)
HCT: 28.1 % — ABNORMAL LOW (ref 35.0–47.0)
HGB: 9.7 g/dL — ABNORMAL LOW (ref 12.0–16.0)
LYMPHS PCT: 29.4 %
Lymphocyte #: 0.6 10*3/uL — ABNORMAL LOW (ref 1.0–3.6)
MCH: 35.5 pg — ABNORMAL HIGH (ref 26.0–34.0)
MCHC: 34.4 g/dL (ref 32.0–36.0)
MCV: 103 fL — AB (ref 80–100)
MONO ABS: 0.2 x10 3/mm (ref 0.2–0.9)
Monocyte %: 7.8 %
NEUTROS ABS: 1.2 10*3/uL — AB (ref 1.4–6.5)
NEUTROS PCT: 61.2 %
PLATELETS: 126 10*3/uL — AB (ref 150–440)
RBC: 2.72 10*6/uL — ABNORMAL LOW (ref 3.80–5.20)
RDW: 21.3 % — ABNORMAL HIGH (ref 11.5–14.5)
WBC: 2 10*3/uL — CL (ref 3.6–11.0)

## 2013-02-28 LAB — PATHOLOGY REPORT

## 2013-03-04 ENCOUNTER — Emergency Department: Payer: Self-pay | Admitting: Emergency Medicine

## 2013-03-04 LAB — URINALYSIS, COMPLETE
BILIRUBIN, UR: NEGATIVE
Bacteria: NONE SEEN
Blood: NEGATIVE
Glucose,UR: NEGATIVE mg/dL (ref 0–75)
Hyaline Cast: 30
LEUKOCYTE ESTERASE: NEGATIVE
Nitrite: NEGATIVE
PROTEIN: NEGATIVE
Ph: 5 (ref 4.5–8.0)
SPECIFIC GRAVITY: 1.025 (ref 1.003–1.030)
Squamous Epithelial: 1
WBC UR: 1 /HPF (ref 0–5)

## 2013-03-04 LAB — CBC
HCT: 36.9 % (ref 35.0–47.0)
HGB: 11.8 g/dL — ABNORMAL LOW (ref 12.0–16.0)
MCH: 33.6 pg (ref 26.0–34.0)
MCHC: 31.9 g/dL — ABNORMAL LOW (ref 32.0–36.0)
MCV: 106 fL — ABNORMAL HIGH (ref 80–100)
Platelet: 143 10*3/uL — ABNORMAL LOW (ref 150–440)
RBC: 3.5 10*6/uL — AB (ref 3.80–5.20)
RDW: 19.6 % — ABNORMAL HIGH (ref 11.5–14.5)
WBC: 4.5 10*3/uL (ref 3.6–11.0)

## 2013-03-05 LAB — COMPREHENSIVE METABOLIC PANEL
Albumin: 2.3 g/dL — ABNORMAL LOW (ref 3.4–5.0)
Alkaline Phosphatase: 74 U/L
Anion Gap: 3 — ABNORMAL LOW (ref 7–16)
BUN: 11 mg/dL (ref 7–18)
Bilirubin,Total: 0.8 mg/dL (ref 0.2–1.0)
CREATININE: 0.79 mg/dL (ref 0.60–1.30)
Calcium, Total: 8.4 mg/dL — ABNORMAL LOW (ref 8.5–10.1)
Chloride: 105 mmol/L (ref 98–107)
Co2: 29 mmol/L (ref 21–32)
EGFR (Non-African Amer.): >60
Glucose: 76 mg/dL (ref 65–99)
Osmolality: 272 (ref 275–301)
Potassium: 5.3 mmol/L — ABNORMAL HIGH (ref 3.5–5.1)
SGOT(AST): 16 U/L (ref 15–37)
SGPT (ALT): <6 U/L — ABNORMAL LOW (ref 12–78)
Sodium: 137 mmol/L (ref 136–145)
Total Protein: 5 g/dL — ABNORMAL LOW (ref 6.4–8.2)

## 2013-03-05 LAB — ETHANOL: Ethanol: <3 mg/dL

## 2013-03-05 LAB — TROPONIN I: Troponin-I: <0.02 ng/mL

## 2013-03-09 ENCOUNTER — Ambulatory Visit: Payer: Self-pay | Admitting: Oncology

## 2013-04-15 ENCOUNTER — Emergency Department: Payer: Self-pay | Admitting: Emergency Medicine

## 2013-04-15 LAB — URINALYSIS, COMPLETE
Bilirubin,UR: NEGATIVE
Glucose,UR: NEGATIVE mg/dL (ref 0–75)
NITRITE: NEGATIVE
PH: 6 (ref 4.5–8.0)
PROTEIN: NEGATIVE
RBC,UR: 16 /HPF (ref 0–5)
SPECIFIC GRAVITY: 1.009 (ref 1.003–1.030)
Squamous Epithelial: 1
WBC UR: 877 /HPF (ref 0–5)

## 2013-04-15 LAB — COMPREHENSIVE METABOLIC PANEL
ALK PHOS: 103 U/L
ANION GAP: 3 — AB (ref 7–16)
AST: 16 U/L (ref 15–37)
Albumin: 2.5 g/dL — ABNORMAL LOW (ref 3.4–5.0)
BUN: 12 mg/dL (ref 7–18)
Bilirubin,Total: 0.8 mg/dL (ref 0.2–1.0)
CALCIUM: 7.8 mg/dL — AB (ref 8.5–10.1)
CO2: 30 mmol/L (ref 21–32)
CREATININE: 0.85 mg/dL (ref 0.60–1.30)
Chloride: 101 mmol/L (ref 98–107)
EGFR (African American): >60
Glucose: 86 mg/dL (ref 65–99)
Osmolality: 267 (ref 275–301)
Potassium: 4 mmol/L (ref 3.5–5.1)
SGPT (ALT): 7 U/L — ABNORMAL LOW (ref 12–78)
SODIUM: 134 mmol/L — AB (ref 136–145)
TOTAL PROTEIN: 5.5 g/dL — AB (ref 6.4–8.2)

## 2013-04-15 LAB — CBC
HCT: 32.1 % — AB (ref 35.0–47.0)
HGB: 10.3 g/dL — ABNORMAL LOW (ref 12.0–16.0)
MCH: 34.7 pg — AB (ref 26.0–34.0)
MCHC: 32.2 g/dL (ref 32.0–36.0)
MCV: 108 fL — AB (ref 80–100)
PLATELETS: 218 10*3/uL (ref 150–440)
RBC: 2.97 10*6/uL — AB (ref 3.80–5.20)
RDW: 14.9 % — ABNORMAL HIGH (ref 11.5–14.5)
WBC: 7.1 10*3/uL (ref 3.6–11.0)

## 2013-04-15 LAB — LIPASE, BLOOD: Lipase: 56 U/L — ABNORMAL LOW (ref 73–393)

## 2013-04-19 LAB — URINALYSIS, COMPLETE
Bilirubin,UR: NEGATIVE
Glucose,UR: NEGATIVE mg/dL (ref 0–75)
NITRITE: NEGATIVE
PH: 6 (ref 4.5–8.0)
Protein: NEGATIVE
RBC,UR: 7 /HPF (ref 0–5)
SPECIFIC GRAVITY: 1.018 (ref 1.003–1.030)
Squamous Epithelial: 2

## 2013-04-19 LAB — CBC
HCT: 29.9 % — ABNORMAL LOW (ref 35.0–47.0)
HGB: 9.7 g/dL — ABNORMAL LOW (ref 12.0–16.0)
MCH: 35.1 pg — ABNORMAL HIGH (ref 26.0–34.0)
MCHC: 32.6 g/dL (ref 32.0–36.0)
MCV: 108 fL — ABNORMAL HIGH (ref 80–100)
Platelet: 223 10*3/uL (ref 150–440)
RBC: 2.77 10*6/uL — ABNORMAL LOW (ref 3.80–5.20)
RDW: 15.1 % — AB (ref 11.5–14.5)
WBC: 4.4 10*3/uL (ref 3.6–11.0)

## 2013-04-19 LAB — BASIC METABOLIC PANEL
Anion Gap: 3 — ABNORMAL LOW (ref 7–16)
BUN: 11 mg/dL (ref 7–18)
CHLORIDE: 105 mmol/L (ref 98–107)
Calcium, Total: 7.8 mg/dL — ABNORMAL LOW (ref 8.5–10.1)
Co2: 27 mmol/L (ref 21–32)
Creatinine: 0.65 mg/dL (ref 0.60–1.30)
EGFR (Non-African Amer.): >60
Glucose: 72 mg/dL (ref 65–99)
Osmolality: 268 (ref 275–301)
Potassium: 4 mmol/L (ref 3.5–5.1)
SODIUM: 135 mmol/L — AB (ref 136–145)

## 2013-04-19 LAB — TROPONIN I: Troponin-I: <0.02 ng/mL

## 2013-04-20 ENCOUNTER — Inpatient Hospital Stay: Payer: Self-pay | Admitting: Internal Medicine

## 2013-04-20 LAB — CK-MB
CK-MB: 0.7 ng/mL (ref 0.5–3.6)
CK-MB: 0.8 ng/mL (ref 0.5–3.6)

## 2013-04-20 LAB — CK TOTAL AND CKMB (NOT AT ARMC)
CK, Total: 24 U/L — ABNORMAL LOW
CK-MB: 0.7 ng/mL (ref 0.5–3.6)

## 2013-04-20 LAB — TROPONIN I
Troponin-I: <0.02 ng/mL
Troponin-I: <0.02 ng/mL

## 2013-04-21 LAB — LIPID PANEL
CHOLESTEROL: 116 mg/dL (ref 0–200)
HDL Cholesterol: 29 mg/dL — ABNORMAL LOW (ref 40–60)
Ldl Cholesterol, Calc: 18 mg/dL (ref 0–100)
Triglycerides: 345 mg/dL — ABNORMAL HIGH (ref 0–200)
VLDL Cholesterol, Calc: 69 mg/dL — ABNORMAL HIGH (ref 5–40)

## 2013-04-22 LAB — CBC WITH DIFFERENTIAL/PLATELET
BASOS ABS: 0 10*3/uL (ref 0.0–0.1)
BASOS PCT: 0.5 %
EOS PCT: 1.1 %
Eosinophil #: 0 10*3/uL (ref 0.0–0.7)
HCT: 24.7 % — ABNORMAL LOW (ref 35.0–47.0)
HGB: 7.6 g/dL — ABNORMAL LOW (ref 12.0–16.0)
Lymphocyte #: 0.8 10*3/uL — ABNORMAL LOW (ref 1.0–3.6)
Lymphocyte %: 22.7 %
MCH: 33.4 pg (ref 26.0–34.0)
MCHC: 30.9 g/dL — ABNORMAL LOW (ref 32.0–36.0)
MCV: 108 fL — AB (ref 80–100)
MONOS PCT: 7.3 %
Monocyte #: 0.2 x10 3/mm (ref 0.2–0.9)
NEUTROS ABS: 2.3 10*3/uL (ref 1.4–6.5)
NEUTROS PCT: 68.4 %
PLATELETS: 140 10*3/uL — AB (ref 150–440)
RBC: 2.29 10*6/uL — AB (ref 3.80–5.20)
RDW: 14.9 % — AB (ref 11.5–14.5)
WBC: 3.3 10*3/uL — ABNORMAL LOW (ref 3.6–11.0)

## 2013-04-22 LAB — BASIC METABOLIC PANEL
Anion Gap: 3 — ABNORMAL LOW (ref 7–16)
BUN: 9 mg/dL (ref 7–18)
CALCIUM: 7.7 mg/dL — AB (ref 8.5–10.1)
CHLORIDE: 106 mmol/L (ref 98–107)
Co2: 29 mmol/L (ref 21–32)
Creatinine: 0.68 mg/dL (ref 0.60–1.30)
EGFR (Non-African Amer.): >60
Glucose: 71 mg/dL (ref 65–99)
Osmolality: 273 (ref 275–301)
POTASSIUM: 4.7 mmol/L (ref 3.5–5.1)
SODIUM: 138 mmol/L (ref 136–145)

## 2013-04-23 LAB — CBC WITH DIFFERENTIAL/PLATELET
BASOS ABS: 0 10*3/uL (ref 0.0–0.1)
Basophil %: 0.6 %
Eosinophil #: 0 10*3/uL (ref 0.0–0.7)
Eosinophil %: 1.2 %
HCT: 30.6 % — AB (ref 35.0–47.0)
HGB: 10.4 g/dL — AB (ref 12.0–16.0)
LYMPHS PCT: 20.5 %
Lymphocyte #: 0.8 10*3/uL — ABNORMAL LOW (ref 1.0–3.6)
MCH: 33.3 pg (ref 26.0–34.0)
MCHC: 33.8 g/dL (ref 32.0–36.0)
MCV: 98 fL (ref 80–100)
Monocyte #: 0.3 x10 3/mm (ref 0.2–0.9)
Monocyte %: 7 %
NEUTROS PCT: 70.7 %
Neutrophil #: 2.6 10*3/uL (ref 1.4–6.5)
Platelet: 128 10*3/uL — ABNORMAL LOW (ref 150–440)
RBC: 3.11 10*6/uL — ABNORMAL LOW (ref 3.80–5.20)
RDW: 21.3 % — AB (ref 11.5–14.5)
WBC: 3.7 10*3/uL (ref 3.6–11.0)

## 2013-04-23 LAB — BASIC METABOLIC PANEL
Anion Gap: 2 — ABNORMAL LOW (ref 7–16)
BUN: 8 mg/dL (ref 7–18)
CHLORIDE: 107 mmol/L (ref 98–107)
CREATININE: 0.63 mg/dL (ref 0.60–1.30)
Calcium, Total: 8.1 mg/dL — ABNORMAL LOW (ref 8.5–10.1)
Co2: 31 mmol/L (ref 21–32)
EGFR (African American): >60
GLUCOSE: 74 mg/dL (ref 65–99)
Osmolality: 276 (ref 275–301)
POTASSIUM: 4.3 mmol/L (ref 3.5–5.1)
Sodium: 140 mmol/L (ref 136–145)

## 2013-04-23 LAB — URINE CULTURE

## 2013-04-26 LAB — CULTURE, BLOOD (SINGLE)

## 2013-05-09 ENCOUNTER — Ambulatory Visit: Payer: Self-pay | Admitting: Internal Medicine

## 2013-05-29 LAB — CBC
HCT: 31.4 % — ABNORMAL LOW (ref 35.0–47.0)
HGB: 10.1 g/dL — AB (ref 12.0–16.0)
MCH: 33.8 pg (ref 26.0–34.0)
MCHC: 32 g/dL (ref 32.0–36.0)
MCV: 105 fL — AB (ref 80–100)
PLATELETS: 266 10*3/uL (ref 150–440)
RBC: 2.99 10*6/uL — ABNORMAL LOW (ref 3.80–5.20)
RDW: 17.4 % — ABNORMAL HIGH (ref 11.5–14.5)
WBC: 8.3 10*3/uL (ref 3.6–11.0)

## 2013-05-29 LAB — DRUG SCREEN, URINE
Amphetamines, Ur Screen: NEGATIVE (ref ?–1000)
BARBITURATES, UR SCREEN: NEGATIVE (ref ?–200)
BENZODIAZEPINE, UR SCRN: NEGATIVE (ref ?–200)
CANNABINOID 50 NG, UR ~~LOC~~: NEGATIVE (ref ?–50)
Cocaine Metabolite,Ur ~~LOC~~: NEGATIVE (ref ?–300)
MDMA (Ecstasy)Ur Screen: POSITIVE (ref ?–500)
METHADONE, UR SCREEN: NEGATIVE (ref ?–300)
OPIATE, UR SCREEN: POSITIVE (ref ?–300)
Phencyclidine (PCP) Ur S: NEGATIVE (ref ?–25)
TRICYCLIC, UR SCREEN: POSITIVE (ref ?–1000)

## 2013-05-29 LAB — COMPREHENSIVE METABOLIC PANEL
Albumin: 2.2 g/dL — ABNORMAL LOW (ref 3.4–5.0)
Alkaline Phosphatase: 85 U/L
Anion Gap: 5 — ABNORMAL LOW (ref 7–16)
BUN: 9 mg/dL (ref 7–18)
Bilirubin,Total: 0.7 mg/dL (ref 0.2–1.0)
CALCIUM: 7.6 mg/dL — AB (ref 8.5–10.1)
Chloride: 108 mmol/L — ABNORMAL HIGH (ref 98–107)
Co2: 26 mmol/L (ref 21–32)
Creatinine: 0.88 mg/dL (ref 0.60–1.30)
EGFR (Non-African Amer.): >60
Glucose: 90 mg/dL (ref 65–99)
Osmolality: 276 (ref 275–301)
Potassium: 4 mmol/L (ref 3.5–5.1)
SGOT(AST): 18 U/L (ref 15–37)
SODIUM: 139 mmol/L (ref 136–145)
Total Protein: 4.6 g/dL — ABNORMAL LOW (ref 6.4–8.2)

## 2013-05-29 LAB — URINALYSIS, COMPLETE
BACTERIA: NONE SEEN
BILIRUBIN, UR: NEGATIVE
Glucose,UR: NEGATIVE mg/dL (ref 0–75)
NITRITE: NEGATIVE
PH: 6 (ref 4.5–8.0)
PROTEIN: NEGATIVE
RBC,UR: 173 /HPF (ref 0–5)
SPECIFIC GRAVITY: 1.018 (ref 1.003–1.030)
WBC UR: 14 /HPF (ref 0–5)

## 2013-05-29 LAB — TROPONIN I: Troponin-I: <0.02 ng/mL

## 2013-05-29 LAB — CK TOTAL AND CKMB (NOT AT ARMC)
CK, Total: 28 U/L
CK-MB: 0.8 ng/mL (ref 0.5–3.6)

## 2013-05-29 LAB — PROTIME-INR
INR: 1.1
PROTHROMBIN TIME: 13.8 s (ref 11.5–14.7)

## 2013-05-30 LAB — TROPONIN I: Troponin-I: <0.02 ng/mL

## 2013-05-31 LAB — CBC WITH DIFFERENTIAL/PLATELET
Basophil #: 0 10*3/uL (ref 0.0–0.1)
Basophil %: 0.5 %
EOS PCT: 1.6 %
Eosinophil #: 0.1 10*3/uL (ref 0.0–0.7)
HCT: 28.1 % — AB (ref 35.0–47.0)
HGB: 9.1 g/dL — AB (ref 12.0–16.0)
LYMPHS ABS: 0.7 10*3/uL — AB (ref 1.0–3.6)
Lymphocyte %: 14.3 %
MCH: 34.2 pg — ABNORMAL HIGH (ref 26.0–34.0)
MCHC: 32.2 g/dL (ref 32.0–36.0)
MCV: 106 fL — ABNORMAL HIGH (ref 80–100)
MONO ABS: 0.2 x10 3/mm (ref 0.2–0.9)
Monocyte %: 3.7 %
NEUTROS PCT: 79.9 %
Neutrophil #: 3.7 10*3/uL (ref 1.4–6.5)
Platelet: 182 10*3/uL (ref 150–440)
RBC: 2.65 10*6/uL — ABNORMAL LOW (ref 3.80–5.20)
RDW: 17.1 % — ABNORMAL HIGH (ref 11.5–14.5)
WBC: 4.7 10*3/uL (ref 3.6–11.0)

## 2013-05-31 LAB — PROTIME-INR
INR: 1.6
Prothrombin Time: 18.5 secs — ABNORMAL HIGH (ref 11.5–14.7)

## 2013-05-31 LAB — BASIC METABOLIC PANEL
Anion Gap: 4 — ABNORMAL LOW (ref 7–16)
BUN: 8 mg/dL (ref 7–18)
Calcium, Total: 7.7 mg/dL — ABNORMAL LOW (ref 8.5–10.1)
Chloride: 112 mmol/L — ABNORMAL HIGH (ref 98–107)
Co2: 26 mmol/L (ref 21–32)
Creatinine: 0.57 mg/dL — ABNORMAL LOW (ref 0.60–1.30)
EGFR (African American): >60
Glucose: 58 mg/dL — ABNORMAL LOW (ref 65–99)
OSMOLALITY: 279 (ref 275–301)
Potassium: 3.8 mmol/L (ref 3.5–5.1)
SODIUM: 142 mmol/L (ref 136–145)

## 2013-06-01 ENCOUNTER — Inpatient Hospital Stay: Payer: Self-pay | Admitting: Internal Medicine

## 2013-06-02 LAB — FOLATE: Folic Acid: 2.3 ng/mL — ABNORMAL LOW (ref 3.1–100.0)

## 2013-06-02 LAB — PROTIME-INR
INR: 4.2
Prothrombin Time: 38.8 secs — ABNORMAL HIGH (ref 11.5–14.7)

## 2013-06-03 LAB — CBC WITH DIFFERENTIAL/PLATELET
Basophil #: 0 10*3/uL (ref 0.0–0.1)
Basophil %: 1 %
EOS ABS: 0.1 10*3/uL (ref 0.0–0.7)
Eosinophil %: 2.4 %
HCT: 28.5 % — ABNORMAL LOW (ref 35.0–47.0)
HGB: 9.2 g/dL — AB (ref 12.0–16.0)
LYMPHS PCT: 26.4 %
Lymphocyte #: 0.6 10*3/uL — ABNORMAL LOW (ref 1.0–3.6)
MCH: 35 pg — ABNORMAL HIGH (ref 26.0–34.0)
MCHC: 32.2 g/dL (ref 32.0–36.0)
MCV: 109 fL — AB (ref 80–100)
Monocyte #: 0.1 x10 3/mm — ABNORMAL LOW (ref 0.2–0.9)
Monocyte %: 5.1 %
NEUTROS ABS: 1.4 10*3/uL (ref 1.4–6.5)
NEUTROS PCT: 65.1 %
Platelet: 139 10*3/uL — ABNORMAL LOW (ref 150–440)
RBC: 2.62 10*6/uL — ABNORMAL LOW (ref 3.80–5.20)
RDW: 17.8 % — ABNORMAL HIGH (ref 11.5–14.5)
WBC: 2.1 10*3/uL — ABNORMAL LOW (ref 3.6–11.0)

## 2013-06-03 LAB — COMPREHENSIVE METABOLIC PANEL
ALK PHOS: 71 U/L
Albumin: 1.8 g/dL — ABNORMAL LOW (ref 3.4–5.0)
Anion Gap: 5 — ABNORMAL LOW (ref 7–16)
BUN: 6 mg/dL — ABNORMAL LOW (ref 7–18)
Bilirubin,Total: 0.7 mg/dL (ref 0.2–1.0)
CALCIUM: 7.7 mg/dL — AB (ref 8.5–10.1)
CO2: 25 mmol/L (ref 21–32)
CREATININE: 0.6 mg/dL (ref 0.60–1.30)
Chloride: 115 mmol/L — ABNORMAL HIGH (ref 98–107)
EGFR (African American): >60
EGFR (Non-African Amer.): >60
Glucose: 64 mg/dL — ABNORMAL LOW (ref 65–99)
OSMOLALITY: 284 (ref 275–301)
POTASSIUM: 3.7 mmol/L (ref 3.5–5.1)
SGOT(AST): 13 U/L — ABNORMAL LOW (ref 15–37)
SGPT (ALT): <6 U/L — ABNORMAL LOW (ref 12–78)
Sodium: 145 mmol/L (ref 136–145)
Total Protein: 4 g/dL — ABNORMAL LOW (ref 6.4–8.2)

## 2013-06-03 LAB — PROTIME-INR
INR: 5.5
Prothrombin Time: 48.4 secs — ABNORMAL HIGH (ref 11.5–14.7)

## 2013-06-09 ENCOUNTER — Ambulatory Visit: Payer: Self-pay | Admitting: Internal Medicine

## 2013-06-10 LAB — COMPREHENSIVE METABOLIC PANEL
ALBUMIN: 1.7 g/dL — AB (ref 3.4–5.0)
ANION GAP: 5 — AB (ref 7–16)
AST: 24 U/L (ref 15–37)
Alkaline Phosphatase: 70 U/L
BILIRUBIN TOTAL: 0.9 mg/dL (ref 0.2–1.0)
BUN: 16 mg/dL (ref 7–18)
CHLORIDE: 113 mmol/L — AB (ref 98–107)
CREATININE: 0.56 mg/dL — AB (ref 0.60–1.30)
Calcium, Total: 7.6 mg/dL — ABNORMAL LOW (ref 8.5–10.1)
Co2: 24 mmol/L (ref 21–32)
GLUCOSE: 69 mg/dL (ref 65–99)
Osmolality: 283 (ref 275–301)
Potassium: 3.7 mmol/L (ref 3.5–5.1)
SGPT (ALT): <6 U/L — ABNORMAL LOW (ref 12–78)
Sodium: 142 mmol/L (ref 136–145)
TOTAL PROTEIN: 4.3 g/dL — AB (ref 6.4–8.2)

## 2013-06-10 LAB — CBC WITH DIFFERENTIAL/PLATELET
BASOS ABS: 0 10*3/uL (ref 0.0–0.1)
BASOS PCT: 0.6 %
EOS ABS: 0 10*3/uL (ref 0.0–0.7)
Eosinophil %: 1.1 %
HCT: 25 % — ABNORMAL LOW (ref 35.0–47.0)
HGB: 8.2 g/dL — ABNORMAL LOW (ref 12.0–16.0)
LYMPHS ABS: 0.5 10*3/uL — AB (ref 1.0–3.6)
Lymphocyte %: 15.2 %
MCH: 35.6 pg — ABNORMAL HIGH (ref 26.0–34.0)
MCHC: 32.7 g/dL (ref 32.0–36.0)
MCV: 109 fL — ABNORMAL HIGH (ref 80–100)
MONOS PCT: 6.7 %
Monocyte #: 0.2 x10 3/mm (ref 0.2–0.9)
NEUTROS ABS: 2.7 10*3/uL (ref 1.4–6.5)
NEUTROS PCT: 76.4 %
PLATELETS: 150 10*3/uL (ref 150–440)
RBC: 2.3 10*6/uL — AB (ref 3.80–5.20)
RDW: 16.7 % — ABNORMAL HIGH (ref 11.5–14.5)
WBC: 3.5 10*3/uL — ABNORMAL LOW (ref 3.6–11.0)

## 2013-06-10 LAB — URINALYSIS, COMPLETE
Bacteria: NONE SEEN
Blood: NEGATIVE
Glucose,UR: NEGATIVE mg/dL (ref 0–75)
Nitrite: NEGATIVE
Ph: 5 (ref 4.5–8.0)
Protein: 100
Specific Gravity: 1.035 (ref 1.003–1.030)
Squamous Epithelial: NONE SEEN
WBC UR: 657 /HPF (ref 0–5)

## 2013-06-10 LAB — CK TOTAL AND CKMB (NOT AT ARMC)
CK, TOTAL: 42 U/L
CK-MB: 0.7 ng/mL (ref 0.5–3.6)

## 2013-06-10 LAB — PROTIME-INR
INR: 1.1
PROTHROMBIN TIME: 14.3 s (ref 11.5–14.7)

## 2013-06-10 LAB — TROPONIN I: TROPONIN-I: 0.03 ng/mL

## 2013-06-10 LAB — APTT: Activated PTT: 27.3 secs (ref 23.6–35.9)

## 2013-06-11 ENCOUNTER — Inpatient Hospital Stay: Payer: Self-pay | Admitting: Internal Medicine

## 2013-06-12 LAB — BASIC METABOLIC PANEL
ANION GAP: 4 — AB (ref 7–16)
BUN: 11 mg/dL (ref 7–18)
Calcium, Total: 7.6 mg/dL — ABNORMAL LOW (ref 8.5–10.1)
Chloride: 114 mmol/L — ABNORMAL HIGH (ref 98–107)
Co2: 24 mmol/L (ref 21–32)
Creatinine: 0.55 mg/dL — ABNORMAL LOW (ref 0.60–1.30)
EGFR (African American): >60
EGFR (Non-African Amer.): >60
GLUCOSE: 63 mg/dL — AB (ref 65–99)
OSMOLALITY: 281 (ref 275–301)
POTASSIUM: 3.4 mmol/L — AB (ref 3.5–5.1)
Sodium: 142 mmol/L (ref 136–145)

## 2013-06-12 LAB — CBC WITH DIFFERENTIAL/PLATELET
Basophil #: 0 10*3/uL (ref 0.0–0.1)
Basophil %: 0.6 %
Eosinophil #: 0 10*3/uL (ref 0.0–0.7)
Eosinophil %: 1.5 %
HCT: 25 % — AB (ref 35.0–47.0)
HGB: 8 g/dL — ABNORMAL LOW (ref 12.0–16.0)
LYMPHS ABS: 0.6 10*3/uL — AB (ref 1.0–3.6)
LYMPHS PCT: 29.6 %
MCH: 35.1 pg — ABNORMAL HIGH (ref 26.0–34.0)
MCHC: 32.1 g/dL (ref 32.0–36.0)
MCV: 109 fL — ABNORMAL HIGH (ref 80–100)
MONOS PCT: 8.4 %
Monocyte #: 0.2 x10 3/mm (ref 0.2–0.9)
Neutrophil #: 1.3 10*3/uL — ABNORMAL LOW (ref 1.4–6.5)
Neutrophil %: 59.9 %
PLATELETS: 118 10*3/uL — AB (ref 150–440)
RBC: 2.29 10*6/uL — AB (ref 3.80–5.20)
RDW: 16.2 % — AB (ref 11.5–14.5)
WBC: 2.1 10*3/uL — AB (ref 3.6–11.0)

## 2013-06-12 LAB — MAGNESIUM: MAGNESIUM: 1.5 mg/dL — AB

## 2013-06-13 LAB — URINE CULTURE

## 2013-06-14 LAB — CBC WITH DIFFERENTIAL/PLATELET
BASOS ABS: 0 10*3/uL (ref 0.0–0.1)
BASOS PCT: 0.4 %
EOS PCT: 1.3 %
Eosinophil #: 0 10*3/uL (ref 0.0–0.7)
HCT: 24.9 % — ABNORMAL LOW (ref 35.0–47.0)
HGB: 8 g/dL — AB (ref 12.0–16.0)
LYMPHS PCT: 18.5 %
Lymphocyte #: 0.6 10*3/uL — ABNORMAL LOW (ref 1.0–3.6)
MCH: 34.8 pg — ABNORMAL HIGH (ref 26.0–34.0)
MCHC: 32.4 g/dL (ref 32.0–36.0)
MCV: 108 fL — ABNORMAL HIGH (ref 80–100)
Monocyte #: 0.3 x10 3/mm (ref 0.2–0.9)
Monocyte %: 8.5 %
NEUTROS ABS: 2.5 10*3/uL (ref 1.4–6.5)
NEUTROS PCT: 71.3 %
PLATELETS: 128 10*3/uL — AB (ref 150–440)
RBC: 2.31 10*6/uL — ABNORMAL LOW (ref 3.80–5.20)
RDW: 15.8 % — ABNORMAL HIGH (ref 11.5–14.5)
WBC: 3.4 10*3/uL — ABNORMAL LOW (ref 3.6–11.0)

## 2013-06-14 LAB — BASIC METABOLIC PANEL
ANION GAP: 6 — AB (ref 7–16)
BUN: 7 mg/dL (ref 7–18)
CREATININE: 0.41 mg/dL — AB (ref 0.60–1.30)
Calcium, Total: 7.8 mg/dL — ABNORMAL LOW (ref 8.5–10.1)
Chloride: 113 mmol/L — ABNORMAL HIGH (ref 98–107)
Co2: 24 mmol/L (ref 21–32)
EGFR (Non-African Amer.): >60
Glucose: 77 mg/dL (ref 65–99)
Osmolality: 282 (ref 275–301)
Potassium: 3.3 mmol/L — ABNORMAL LOW (ref 3.5–5.1)
SODIUM: 143 mmol/L (ref 136–145)

## 2013-06-16 LAB — CULTURE, BLOOD (SINGLE)

## 2013-07-09 ENCOUNTER — Ambulatory Visit: Payer: Self-pay | Admitting: Internal Medicine

## 2013-08-09 DEATH — deceased

## 2014-02-07 IMAGING — CT CT CHEST W/O CM
1 series · 15 of 34 positions shown, 19 images · non-contrast
Comparison: none

REASON FOR EXAM: pulmonary nodule  FU
COMMENTS:

PROCEDURE:     CT  - CT CHEST WITHOUT CONTRAST  - November 07, 2012  [DATE]
RESULT:     History: Pulmonary nodule.
Comparison Study: Prior CT of 04/17/2012.

[Series 2: soft tissue · axial · 0.75mm/px · z∈[-206,+62]mm · 15 of 105 slices shown, 19 images]
[im 8/105  mediastinal]
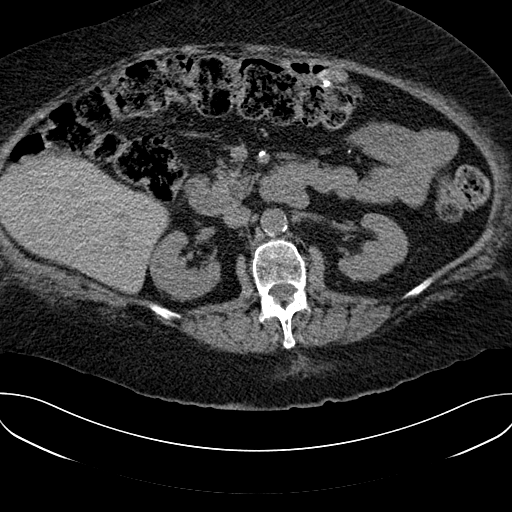
[im 8/105  lung]
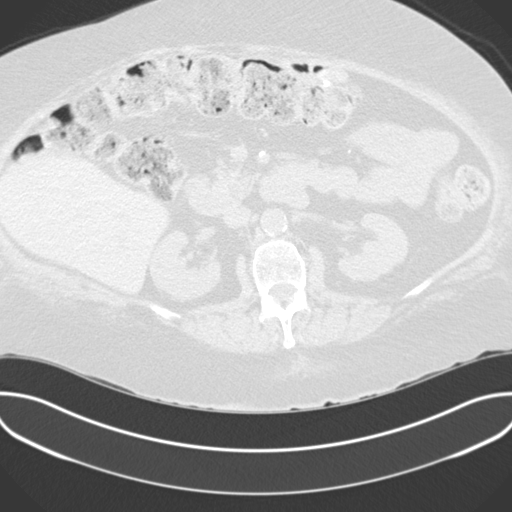
[im 16/105  lung]
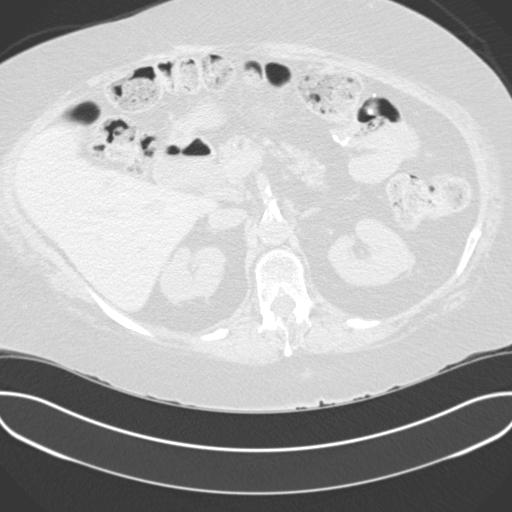
[im 21/105  lung]
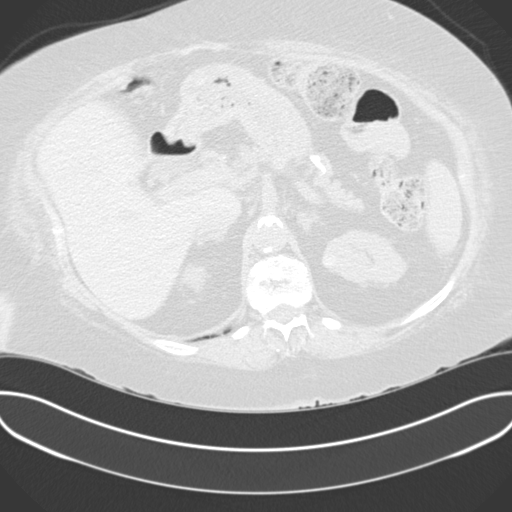
[im 27/105  lung]
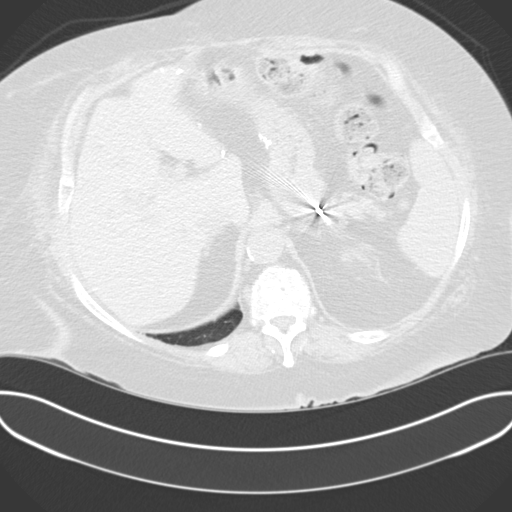
[im 35/105  mediastinal]
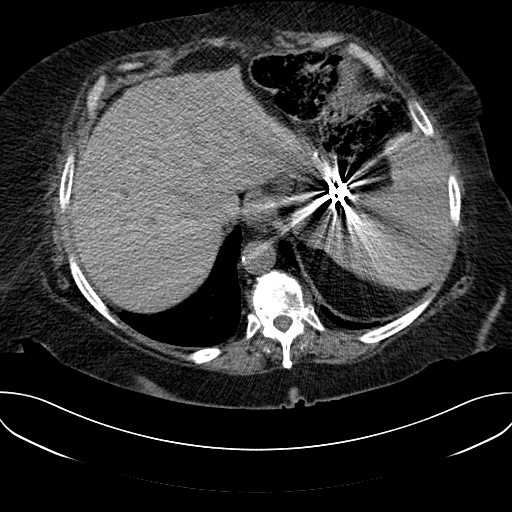
[im 35/105  lung]
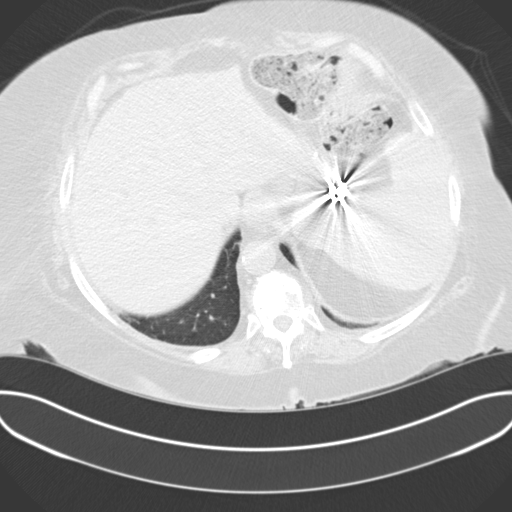
[im 42/105  lung]
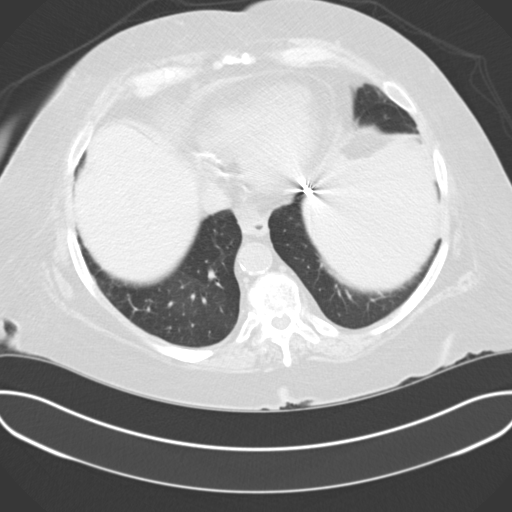
[im 47/105  lung]
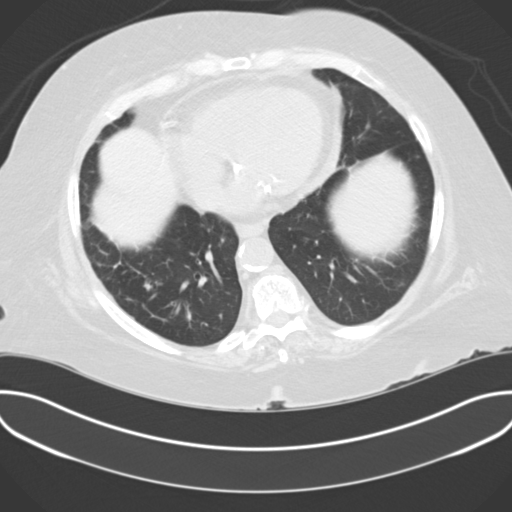
[im 54/105  lung]
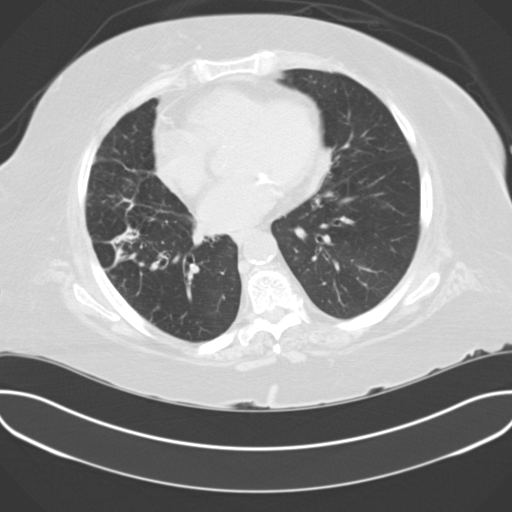
[im 58/105  mediastinal]
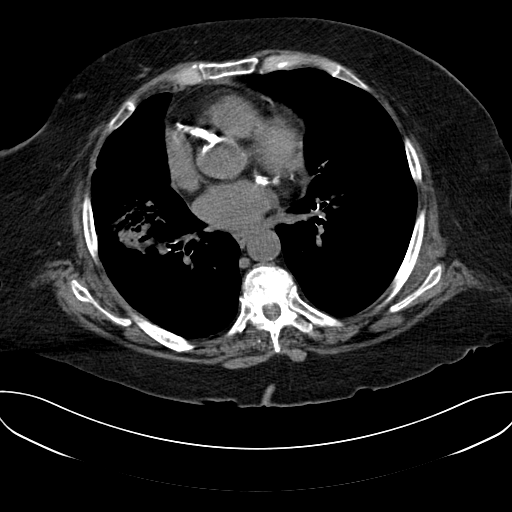
[im 58/105  lung]
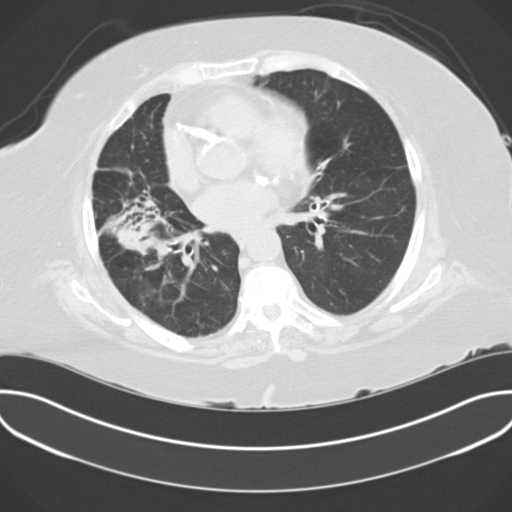
[im 63/105  lung]
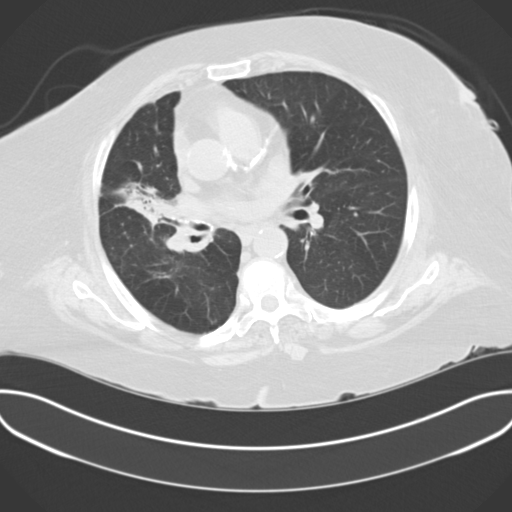
[im 70/105  lung]
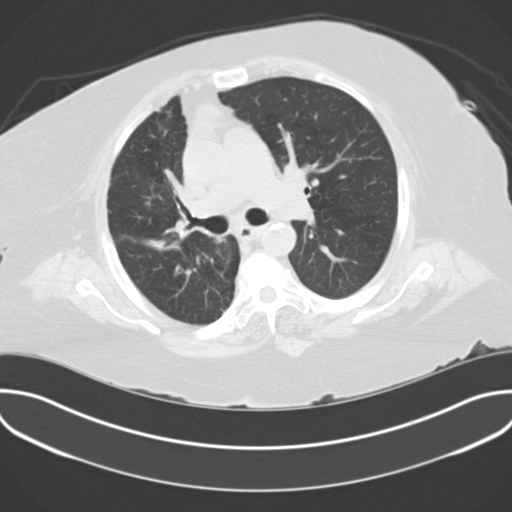
[im 78/105  lung]
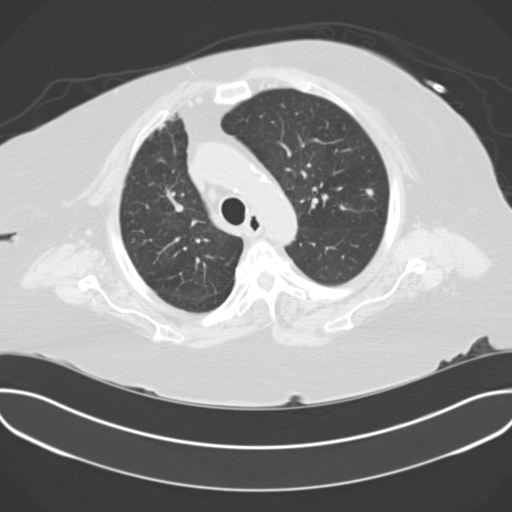
[im 84/105  mediastinal]
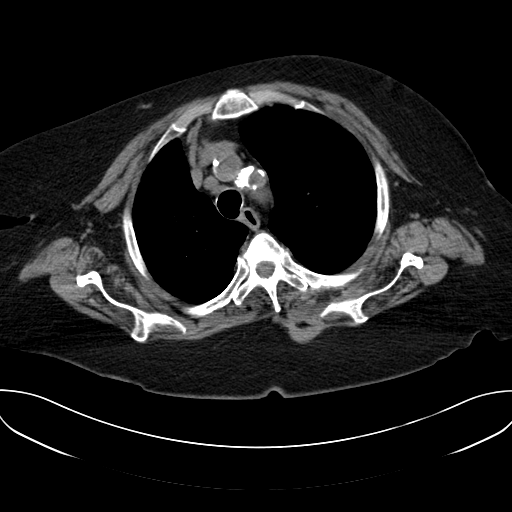
[im 84/105  lung]
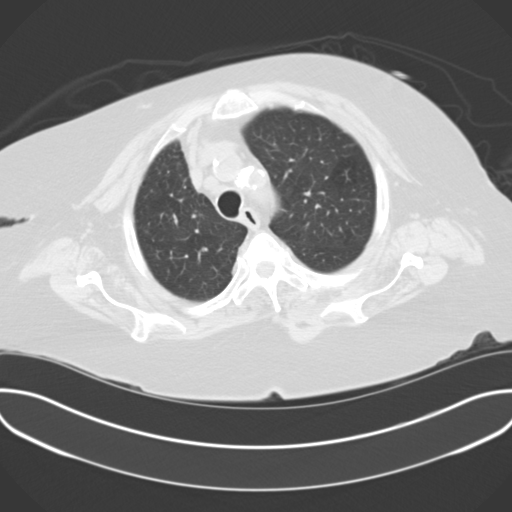
[im 89/105  lung]
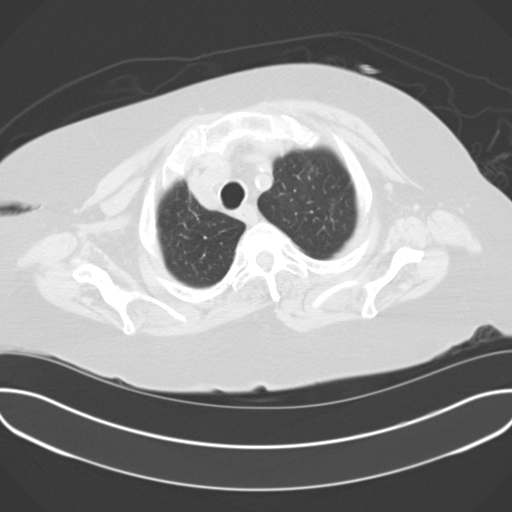
[im 97/105  lung]
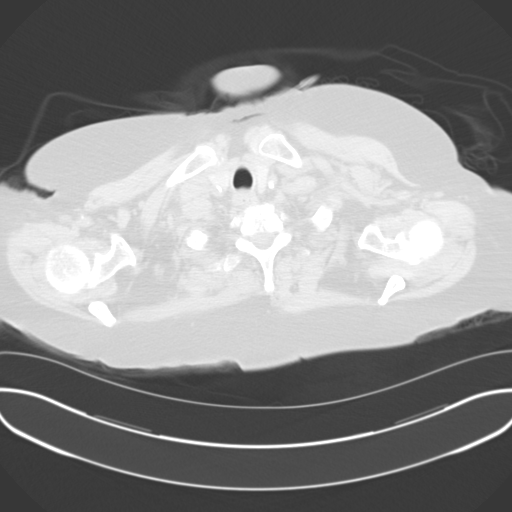

[15 of 34 positions shown; findings below may reference images not displayed]

FINDINGS: Standard nonenhanced CT obtained. Pulmonary nodule is no the left
upper lobe lung windows image 28. This is slightly more prominent from prior
exam and now measures approximately 6 mm. This could be technique however
interim growth cannot be excluded. Airspace disease again noted in the right
middle lobe with bronchiectasis appears similar finding in to a lesser
degree noted lingula. These findings are stable. No new pulmonary nodules
are noted. Shotty mediastinal stable lymph nodes are present.

Heart size normal. Coronary artery disease. Small pericardial effusion
prominence is stable. Surgical clips are noted the upper abdomen. Adrenals
are normal. Left nephrolithiasis. Hyperdense nodule left upper kidney most
likely hyperdense cyst. Diffuse degenerative changes thoracic spine. No
focal acute osseous abnormality identified.
IMPRESSION: 1. Partial interval slight increase in size of left upper lobe pulmonary
nodule. Pulmonary nodule measures approxi- 6 mm. No new pulmonary nodules
are noted.
2. Persistent atelectasis and/or airspace disease with bronchiectasis right
middle lobe and to a lesser extent in the lingula.
3. 9 mm hyperdense nodular density in the left upper kidney. Renal
ultrasound suggested to determine if this is a cystic or solid lesion.

## 2014-04-28 NOTE — Consult Note (Signed)
Brief Consult Note: Diagnosis: recurrent nausea, possible transfer dysphagia.   Patient was seen by consultant.   Consult note dictated.   Recommend further assessment or treatment.   Comments: Patietn seen and examined.  Please see full GI consult [phone]#[id number].  Patient admitted with respiratory distress and possible unstable angina.   Patient with atypical chest discomfort, and possible transfer dysphagia.  I would recommend a standard barium swallow and a modified barium when clinically feasible.  She is not a candidate for sedated luminal evauation at present.   Following.  Electronic Signatures: Barnetta ChapelSkulskie, Jaheim Canino (MD)  (Signed 28-Dec-13 18:42)  Authored: Brief Consult Note   Last Updated: 28-Dec-13 18:42 by Barnetta ChapelSkulskie, Huberta Tompkins (MD)

## 2014-04-28 NOTE — H&P (Signed)
PATIENT NAME:  Ann Orozco, Ann Orozco MR#:  [id number] DATE OF BIRTH:  October 03, 1945  DATE OF ADMISSION:  11/21/2011  PRIMARY CARE PHYSICIAN: Aram BeechamJeffrey Sparks, MD  ER REFERRING PHYSICIAN: Suella BroadLinda Taylor, MD  CHIEF COMPLAINT: Shortness of breath.   HISTORY OF PRESENT ILLNESS: The patient is a 69 year old female with history of chronic obstructive pulmonary disease on home oxygen, history of hypertension, and history of pulmonary embolus on anticoagulation who came by EMS and saw the psychiatry physician and then there she felt so short of breath with nausea and tightness so she was sent here. The patient was seen in the ER and the ER doctor referred me for admission for COPD exacerbation. I went in and spoke to the patient who says that she is having trouble breathing more worse than usual and is going on for two weeks. The patient has no cough, except her chronic cough, no fevers, and has felt tight in the chest. The patient tried to bump up the oxygen from 2 to 5 liters and also used nebulizers. It did not help. Shortness of breath was so intense that she could not even walk from the bed to the chair. She is scheduled for cardiac catheterization by Dr. Gwen PoundsKowalski this Thursday. She sees Dr. Meredeth IdeFleming from pulmonology. Because of the chest tightness and shortness of breath, she is scheduled for cardiac catheterization. The patient feels tight in the chest and also with tightness in the chest sometimes feels numbness in the left hand and feels very weak. The patient also feels dizzy. No nausea, no vomiting, and no cough. Denies any fever.   PAST MEDICAL HISTORY:  1. Sleep apnea. 2. Pulmonary emboli, on anticoagulation. 3. Obesity. 4. Hypertension. 5. Depression. 6. Osteoarthritis. 7. Hyperlipidemia. 8. Degenerative disease of the spine. 9. Osteoarthritis. 10. Gout.   PAST SURGICAL HISTORY:  1. Multiple abdominal surgeries. 2. Hernia surgery.   ALLERGIES: Reglan, Talwin, tetracycline, codeine, Minocin,  Levaquin, contrast dye, and morphine.   SOCIAL HISTORY: Quit smoking in 2009. No alcohol. No drugs.   FAMILY HISTORY: Significant for hypertension, Alzheimer's disease, and coronary artery disease.  MEDICATIONS: 1. Advair Diskus 500/50 one puff twice a day. 2. Albuterol 90 mcg inhalation 2 puffs four times daily. 3. Fosamax 70 mg daily.  4. Allopurinol 300 mg p.o. daily.  5. Atorvastatin 20 mg p.o. daily.  6. Carbidopa/levodopa 25/100 mg two tablets p.o. three times daily. 7. Daliresp 500 mcg p.o. daily.  8. Ferrous sulfate 325 mg p.o. twice a day. 9. Fluticasone nasal spray 50 mcg two sprays once a day.  10. Ativan 1 mg orally 1 to 2 times a day for anxiety. 11. Loratadine 10 mg daily.  12. Montelukast 10 mg daily.  13. Nabumetone 750 mg p.o. twice a day. 14. Toviaz 8 mg p.o. daily.  .  16. Nuvigil 150 mg p.o. daily.  17. Nexium 40 mg one capsule daily.  18. Tramadol 50 mg 2 tablets every 6 hours p.r.n. for pain. 19. Venlafaxine 75 mg p.o. daily. 20. Coumadin 7.5 mg once a day. 21. Verapamil 240 mg p.o. daily.  22. Zolpidem 10 mg daily   REVIEW OF SYSTEMS: CONSTITUTIONAL: Feels so weak and short of breath on minimal ambulation. EYES: No blurred vision. ENT: No tinnitus. No epistaxis. RESPIRATORY: Has chronic cough and shortness of breath on minimal exertion. CARDIOVASCULAR: Also feels chest tightness, feels like she is going to pass out. No orthopnea. No PND. Did not actually pass out. Has dyspnea on exertion. GASTROINTESTINAL: Does feel nauseous. No abdominal  pain.  GENITOURINARY: No dysuria. ENDOCRINE: No polyuria or nocturia. INTEGUMENTARY: No skin rashes. MUSCULOSKELETAL: Has history of gout, but no recent flares. Has history of back pain. NEUROLOGIC: No numbness or weakness. PSYCH: No anxiety or insomnia.   PHYSICAL EXAMINATION:   VITALS: Temperature 97.7, pulse 97, respirations 18, blood pressure 138/74, and saturation 98% on 4 liters.   GENERAL: She is alert, awake, and  oriented.   HEAD/EYES: Head atraumatic, normocephalic. Pupils equally reacting to light. Extraocular movements are intact.   ENT: No tympanic membrane congestion. No turbinate hypertrophy. No oropharyngeal erythema.   NECK: Normal range of motion. No JVD. No carotid bruit.   CARDIOVASCULAR: S1 and S2 regular. No murmurs. PMI not displaced.  PULMONARY: Lungs are mostly clear to auscultation, diminished overall. No wheezing. No rales.   ABDOMEN: Soft. Bowel sounds present. No organomegaly. No hernias. No CVA tenderness.   EXTREMITIES: No extremity edema. No cyanosis or clubbing.   NEUROLOGICAL: Awake, awake, and oriented. Cranial nerves II through XII intact. Power five out of five in upper and lower extremities. Sensation is intact. DTRs are 2+ bilaterally.   PSYCH: Mood and affect are within normal limits.  LABORATORY, DIAGNOSTIC AND RADIOLOGIC DATA: CK total 84 and CPK-MB 0.7.   Electrolytes: Sodium 113, potassium 4.9, chloride 106, bicarbonate 24, BUN 18, creatinine 0.80, and glucose 102.   Liver function tests are within normal limits.   WBC 9.7, hemoglobin 12.4, hematocrit 36.2, and platelets 138.   Troponin less than 0.02.   ABG: pH 7.43, pCO2 40, pO2 87, and saturation 95.8%. This is done on 4 liters nasal cannula.   Chest x-ray shows no definite pneumonia or congestive heart failure, cannot exclude acute bronchitis appearance. There is clouding in the right mid lung.   EKG: Normal sinus rhythm at 96 beats per minute. No ST-T changes.  ASSESSMENT AND PLAN:  1. The patient is a 69 year old female with tightness in the chest tightness, exertional dyspnea, and acute on chronic respiratory failure. Because of her shortness of breath and unable to ambulate, the patient is going to be admitted to the hospitalist service on telemetry. The patient is already scheduled for cardiac catheterization by Dr. Gwen Pounds on Thursday, but because of the chest pain and ongoing dyspnea, we will  request consultation from Dr. Gwen Pounds during this hospital stay and continue her on her home medications for her respiratory failure and chronic obstructive pulmonary disease with oxygen, Advair, albuterol, and Spiriva. She is allergic to prednisone. Anyway she is not wheezing, so we will not start on Solu-Medrol. She has acute bronchitis so she got Rocephin and Zithromax. We can continue Zithromax for her.  2. Hypertension. She is on Verapamil 240 mg. Continue that.  3. History of PE. She is on Coumadin, but she has not taken it for almost three to four days because of elevated PT-INR and she was told to discontinue it so we will check PT and INR and resume the Coumadin.  4. History of depression. She is on trazodone and also Effexor XR. Continue that.  5. Chronic pain. She is on tramadol. Continue that.  6. History of dementia. She is on carbidopa/levodopa. Continue that.  7. Anxiety. She is on Ativan. Continue that.  8. GI prophylaxis and DVT prophylaxis.       We will sign out to [facility].   TIME SPENT ON HISTORY AND PHYSICAL: About 55 minutes. ____________________________ Katha Hamming, MD sk:slb D: 11/21/2011 15:30:17 ET T: 11/21/2011 16:04:32 ET JOB#: [id number]  cc:  Katha Hamming, MD, <Dictator> Duane Lope. Judithann Sheen, MD Katha Hamming MD ELECTRONICALLY SIGNED 11/21/2011 19:55

## 2014-04-28 NOTE — H&P (Signed)
PATIENT NAME:  Ann Orozco, Ann Orozco MR#:  [id number] DATE OF BIRTH:  05/22/1945  DATE OF ADMISSION:  01/05/2012  REFERRING PHYSICIAN: Janalyn Harder, MD  PRIMARY CARE PHYSICIAN: Aram Beecham, MD   CHIEF COMPLAINT: Chest tightness, chest pain, shortness of breath, cough and congestion.   HISTORY OF PRESENT ILLNESS: This is a 69 year old female with extensive past medical history mostly noted for coronary artery disease with recent cardiac catheterization in November 2013 showing right coronary artery disease with good collaterals, history of COPD on 4 liters nasal cannula, currently on home dose taper of steroids, history of pulmonary embolism on chronic anticoagulation with warfarin and obstructive sleep apnea on CPAP who presents with complaints of chest pain, tightness, cough with productive sputum and shortness of breath. The patient reports chest pain has been going on for the last few days, no relieving and no provoking factor, has been there for the last day, progressing. It was initially intermittent but then became more constant which prompted her to come to the ED. The patient reported chest pain is worsened upon taking deep inspiration. As well it worsened by my physical examination and with chest palpation. Reports her chest pain is currently much improved. The patient had positive troponin of 0.13. As well the patient has been complaining of cough with productive sputum and shortness of breath and significant wheezing. She was short of breath and hypoxic upon presentation requiring multiple nebulizers and IV Solu-Medrol which improved her shortness of breath. She reports she feels congested but able to cough. She was found to have right lung infiltrate on the chest x-ray as well. The patient is known to have history of pulmonary embolism, on warfarin for anticoagulation. Her INR level was 6 today. The patient denies any bleed, denies any coffee-ground emesis, any melena or bright red blood per  rectum. The patient was Hemoccult negative in the ED. As well the patient was hypotensive in the ED with systolic blood pressure in the mid 80s, but she was asymptomatic, did not complain of any dizziness, lightheadedness or any altered mental status. Hospitalist service was requested to admit the patient for further management of her complaints.   PAST MEDICAL HISTORY: 1. Sleep apnea on CPAP.  2. Pulmonary embolism on anticoagulation.  3. Obesity.  4. Hypertension.  5. Depression.  6. Osteoarthritis.  7. Hyperlipidemia.  8. Degenerative disease of the spine.  9. Osteoarthritis.  10. Gout.  11. Coronary artery disease with recent cardiac catheterization by Dr. Gwen Pounds in November of this year showing right coronary artery disease with good collaterals.   PAST SURGICAL HISTORY: 1. Multiple abdominal surgeries.  2. Hernia surgery.   ALLERGIES: REGLAN, TALWIN, TETRACYCLINE, CODEINE, MIOCIN, LEVAQUIN, CONTRAST DYE AND MORPHINE.   SOCIAL HISTORY: Quit smoking in 2009. No alcohol. No drugs.   FAMILY HISTORY: Significant for hypertension, Alzheimer's and coronary artery disease.   HOME MEDICATIONS: 1. Advair 500/50 twice a day.  2. Albuterol inhalation 2 puffs 4 times a day.  3. Fosamax 70 mg daily.  4. Allopurinol 300 mg oral daily.  5. Atorvastatin 20 mg oral daily.  6. Carbidopa/levodopa 25/100 mg 2 tablets p.o. 3 times a day.  7. Daliresp 500 mcg oral daily.  8. Ferrous sulfate 325 mg p.o. 2 times a day. 9. Fluticasone 50 mcg two sprays once a day.  10. Ativan 1 mg one to two times a day as needed for anxiety.  11. Loratadine 10 mg oral daily.  12. Montelukast 10 mg oral daily.  13. Nabumetone 750  mg oral 2 times a day.  14. Toviaz 8 mg p.o. daily.  15. Nexium 40 mg oral daily.  16. Tramadol 50 mg oral 1 tablet every 6 hours as needed for pain.  17. Venlafaxine 120 mg oral daily.  18. Warfarin 10 mg oral daily.  19. Verapamil 240 mg oral daily.  20. Zolpidem 10 mg oral  daily.  21. Trazodone 100 mg 2 tablets oral daily.  22. MiraLax 17 grams oral daily as needed.  23. Sublingual nitroglycerin 0.5 mg as needed for chest pain.  24. Metoprolol 25 mg oral 2 times a day.  25. Lovaza 1000 mg oral two tablets 2 times a day. 26. Isosorbide mononitrate 30 mg oral daily.  27. Lasix 40 mg oral daily.  28. Citalopram 2 tablets oral daily.  29. Aspirin 81 mg oral daily.   REVIEW OF SYSTEMS:   CONSTITUTIONAL: The patient denies any fever or chills but complains of fatigue and weakness.   EYES: Denies blurry vision, double vision or pain.   ENT: Denies tinnitus, ear pain, hearing loss or epistaxis.   RESPIRATORY: Denies any asthma. Has complaints of cough, wheezing, dyspnea and productive sputum. Denies any hemoptysis.  CARDIOVASCULAR: Complains of chest tightness and chest pain. Denies edema, arrhythmias, palpitations or syncope.   GASTROINTESTINAL: Denies nausea, vomiting, diarrhea, abdominal pain, hematemesis, melena or coffee-ground emesis.   GENITOURINARY: Denies dysuria, hematuria or renal colic.   ENDOCRINE: Denies polyuria, polydipsia or heat or cold intolerance.   HEMATOLOGY: Denies anemia, easy bruising or bleeding diathesis.   INTEGUMENTARY: Denies acne, rash or skin lesions.   MUSCULOSKELETAL: Denies any redness or cramps. Has history of gout and arthritis. Has limited activity.   NEUROLOGIC: Denies any CVA, seizures, memory loss, dementia or epilepsy. Complains of generalized weakness.   PSYCH: Has history of anxiety, insomnia, nervousness and depression.   PHYSICAL EXAMINATION:  VITAL SIGNS: Temperature 98.6, pulse 68, respiratory rate 20, blood pressure 94/49 and saturating 96% on oxygen.   GENERAL: Morbidly obese female who looks comfortable in bed and in no apparent distress.   HEENT: Head atraumatic, normocephalic. Pupils equal and reactive to light. Pink conjunctivae. Anicteric sclerae. Moist oral mucosa.   NECK: Supple. No  thyromegaly. No JVD.   PULMONARY: Chest has tenderness to palpation present with chest pain upon presentation, had scattered wheezing and rales.   CARDIOVASCULAR: S1 and S2 heard. No rubs, murmurs or gallops.   ABDOMEN: Obese, soft, nontender and nondistended. Bowel sounds present.   EXTREMITIES: No edema. No clubbing. No cyanosis. Has significant muscle wasting in her extremities.  NEUROLOGIC: Awake and alert x 3. Cranial nerves grossly intact. Good sensation. Symmetrical deep tendon reflexes, +2 bilaterally.   PSYCH: Awake and alert x 3. Intact judgment and insight.   SKIN: Normal skin turgor. Warm and dry.   PERTINENT LABS: Glucose 102, BUN 30, creatinine 1.04, sodium 140, potassium 4.6, chloride 105 and CO2 27. Troponin 0.13. White blood cells 10.5, hemoglobin 9.9, hematocrit 29.9 and platelets 129. INR 6.  ASSESSMENT AND PLAN: A 69 year old female who presents with chest tightness, shortness of breath and cough with known history CAD with recent cardiac catheterization in November 2013, as well known history of COPD on 4 liters nasal cannula at home, who has positive troponins, right lung infiltrate, diffuse wheezing and supratherapeutic INR at 6.  1. Chest pain with elevated troponins. The patient is known to have history of coronary artery disease, has recent catheterization showing significant right coronary artery stenosis, but has good collaterals. Given  the fact she has positive troponins, she will be admitted to the CCU. I discussed with Dr. Gwen Pounds. At this point, it is unclear if the patient is having actual MI versus demand ischemia, which is a strong possibility secondary to her pneumonia, hypoxia and hypotension. For the time being, we will continue to cycle the patient's troponins, we will continue her on aspirin and statin, and regardless of the actual diagnosis at this point she cannot be put on any anticoagulation as her INR is significantly elevated at 6. As well, she is  hypotensive and cannot tolerate any beta blockers or ACE inhibitors.  2. Pneumonia. The patient has right lung infiltrate as initial evaluation by ED physician, still awaiting official reading. Blood cultures were sent. Secondary to the patient's significant multiple allergies, she will be started on Azactam.  3. COPD exacerbation. The patient has significant wheezing. She was started on IV Solu-Medrol and PPI to cover her at a large dose. As well, we will continue her on nebulizers. We will continue her home medications of Daliresp, Spiriva and Advair.  4. Coagulopathy. We will continue to hold the patient's warfarin. We will monitor INR closely. At this point, there is no indication for vitamin K or FFP, as the patient has no evidence of bleed, if the patient needs warfarin for PE.  5. Anemia. The patient is Hemoccult-negative in the ED. We will continue with iron supplements.  6. Hypotension. The patient will be admitted to CCU.  We will give 250 normal saline fluid bolus. We will hold her hypertension medication and Imdur and pain medications and Lasix.  7. Sleep apnea. We will continue the patient on CPAP.  8. Pulmonary embolism. The patient is on anticoagulation. Currently the patient's INR is subtherapeutic so will be held.  9. Obesity. The patient appeared to be having truncal obesity, more like cushingoid features. This is most likely due to being on steroid for extended periods at times due to her COPD.  10. Hypertension. We will hold medications secondary to hypotension.  11. Depression. Continue on medications. 12. Hyperlipidemia. Continue with statin.  13. Gout. Continue with allopurinol.  14. DVT prophylaxis. The patient has subtherapeutic INR.  15. GI prophylaxis. The patient is on PPI.   CODE STATUS: Discussed with the patient and she wishes to be FULL CODE.  TOTAL TIME SPENT ON ADMISSION AND PATIENT CARE: 70 minutes  ____________________________ Starleen Arms,  MD dse:sb D: 01/05/2012 08:03:27 ET T: 01/05/2012 08:48:12 ET JOB#: [id number]  cc: Starleen Arms, MD, <Dictator> Rogene Meth Teena Irani MD ELECTRONICALLY SIGNED 01/06/2012 0:41

## 2014-04-28 NOTE — Consult Note (Signed)
Chief Complaint:   Subjective/Chief Complaint seen for possible dysphagia.  patient doing well, although breathing is still difficult at times.  tolerating liquids and some solids po.   VITAL SIGNS/ANCILLARY NOTES: **Vital Signs.:   29-Dec-13 14:25   Vital Signs Type Upon Transfer   Temperature Temperature (F) 98   Celsius 36.6   Temperature Source oral   Pulse Pulse 97   Respirations Respirations 28   Systolic BP Systolic BP 409   Diastolic BP (mmHg) Diastolic BP (mmHg) 83   Mean BP 103   Pulse Ox % Pulse Ox % 98   Pulse Ox Activity Level  At rest   Oxygen Delivery 4L   Brief Assessment:   Cardiac Regular    Respiratory clear BS    Gastrointestinal details normal Bowel sounds normal  morbidly obese, non-tender,   Lab Results: Hepatic:  29-Dec-13 04:04    Bilirubin, Total 0.2   Alkaline Phosphatase 83   SGPT (ALT)  7   SGOT (AST) 15   Total Protein, Serum  5.2   Albumin, Serum  2.4  Routine Chem:  29-Dec-13 04:04    Glucose, Serum  135   BUN  27   Creatinine (comp) 0.90   Sodium, Serum 143   Potassium, Serum 4.8   Chloride, Serum  109   CO2, Serum 27   Calcium (Total), Serum  8.0   Osmolality (calc) 292   eGFR (African American) >60   eGFR (Non-African American) >60 (eGFR values <35m/min/1.73 m2 may be an indication of chronic kidney disease (CKD). Calculated eGFR is useful in patients with stable renal function. The eGFR calculation will not be reliable in acutely ill patients when serum creatinine is changing rapidly. It is not useful in  patients on dialysis. The eGFR calculation may not be applicable to patients at the low and high extremes of body sizes, pregnant women, and vegetarians.)   Anion Gap 7  Routine Coag:  29-Dec-13 04:04    Prothrombin  27.0   INR 2.5 (INR reference interval applies to patients on anticoagulant therapy. A single INR therapeutic range for coumarins is not optimal for all indications; however, the suggested range for most  indications is 2.0 - 3.0. Exceptions to the INR Reference Range may include: Prosthetic heart valves, acute myocardial infarction, prevention of myocardial infarction, and combinations of aspirin and anticoagulant. The need for a higher or lower target INR must be assessed individually. Reference: The Pharmacology and Management of the Vitamin K  antagonists: the seventh ACCP Conference on Antithrombotic and Thrombolytic Therapy. CWJXBJ.4782Sept:126 (3suppl): [id number] A HCT value >55% may artifactually increase the PT.  In one study,  the increase was an average of 25%. Reference:  "Effect on Routine and Special Coagulation Testing Values of Citrate Anticoagulant Adjustment in Patients with High HCT Values." American Journal of Clinical Pathology 2006;126:400-405.)  Routine Hem:  29-Dec-13 04:04    WBC (CBC) 5.6   RBC (CBC)  2.82   Hemoglobin (CBC)  8.8   Hematocrit (CBC)  26.5   Platelet Count (CBC)  132   MCV 94   MCH 31.0   MCHC 33.0   RDW  14.8   Neutrophil % 89.1   Lymphocyte % 7.7   Monocyte % 3.2   Eosinophil % 0.0   Basophil % 0.0   Neutrophil # 4.9   Lymphocyte #  0.4   Monocyte # 0.2   Eosinophil # 0.0   Basophil # 0.0 (Result(s) reported on 07 Jan 2012 at 04:53AM.)  Assessment/Plan:  Assessment/Plan:   Assessment 1) atypical dysphagia-seems moreso a transfer dysphagia/oropharyngeal phase    Plan 1) will order MBSS for tomorrow.  Will need to get a standard barium swallow once the baruim from the previous study is cleared.  Not a candidate for egd due to current respiratory status.   Electronic Signatures: Loistine Simas (MD)  (Signed 29-Dec-13 18:37)  Authored: Chief Complaint, VITAL SIGNS/ANCILLARY NOTES, Brief Assessment, Lab Results, Assessment/Plan   Last Updated: 29-Dec-13 18:37 by Loistine Simas (MD)

## 2014-04-28 NOTE — Consult Note (Signed)
PATIENT NAME:  Ann Orozco, Ann Orozco MR#:  [id number] DATE OF BIRTH:  [birth year]  DATE OF CONSULTATION:  01/06/2012  REFERRING PHYSICIAN:   Aram BeechamJeffrey Sparks, MD CONSULTING PHYSICIAN:  Christena DeemMartin U. Domanique Huesman, MD  REASON FOR CONSULTATION: Dysphagia.   HISTORY OF PRESENT ILLNESS:  This patient is a 69 year old Caucasian female who was admitted to the hospital on 01/05/2012 with a complaint of chest tightness, chest pain, shortness of breath, cough and congestion. She has a history of obstructive sleep apnea and COPD, this requiring a CPAP. She has a history of coronary artery disease and is on chronic 4-liter nasal cannula. The patient came to the Emergency Room and was admitted for further evaluation of chest pain. Further, she has a history of a pneumonia that apparently started up and was treated as an outpatient starting at least a month and a half to 2 months ago. She had an apparent allergic reaction to a medication, came back to the ER, was given different medication, the problem not resolving over a period of time. She stated that she had a heart catheterization done about 2 to 3 weeks ago by Dr. Gwen PoundsKowalski. She has been experiencing increasing shortness of breath since that time. She stated that before all of these problems began she started to have some problems with come and go nausea. She would occasionally throw up. When the nausea came she would take some Pepto-Bismol or throw up or use some Phenergan. She has had some intermittent an again, occasional epigastric pain. She does take Nexium on a regular basis. She denies any heartburn with the exception of a couple of food-related items giving her some reflux within the past 6 months. She denies any actual dysphagia with the exception of stating that sometimes it is difficult for her to swallow things and from this, she is not referring to difficulty of things going down the esophagus, but more so what sounds like a transfer dysphagia from the posterior  pharynx into the esophageal passage. She is edentulous. She is unable to chew food very well. She states she does cut things up well. There has been no odynophagia. She does not regurgitate food. She takes MiraLAX on a regular basis to assist her with bowel movements. She states taking MiraLAX regularly, she will have a bowel movement every 1 to 2 days. She denies any black stools, blood in the stools or slimy stools. Her stools are dark and she does take iron on a regular basis. She was Hemoccult negative in the ER on admission.   In review, her last colonoscopy was 08/17/2010 which had a finding of diverticulosis and hemorrhoids internally. She has had apparently EGDs in the past. I [facility]. She has had gastric bypass surgery x 3 in 1974, 1973 and about 1983.   PAST MEDICAL HISTORY:   1.  History of pulmonary embolism on anticoagulation. 2.  Obesity. 3.  Hypertension.  4.  Depression.  5.  Osteoarthritis.  6.  Hyperlipidemia.  7.  Degenerative spinal disease.  8.  Osteoarthritis.  9.  Gout.  10.  Coronary artery disease as noted above with right coronary disease and adequate apparent collaterals.  11.  Multiple abdominal surgeries as noted above to include a ruptured hernia surgery, appendectomy and cholecystectomy when she had her gastric bypass surgeries.  12.  History of sleep apnea on CPAP.   GASTROINTESTINAL FAMILY HISTORY: Negative for colorectal cancer, liver disease or ulcers.   OUTPATIENT MEDICATIONS:  Advair Diskus  500/50 mcg inhaler powder, albuterol inhaler, alendronate 70 mg once a week, allopurinol 300 mg once a day, 81 mg aspirin daily, atorvastatin 20 mg once a day, carbidopa/levodopa 25/100 mg 2 tablets 3 times a day. This is taken for tremor. The patient states she does not have a history of Parkinson's.  Daliresp 500 mcg once a day, DuoNebs inhalers 4 times a day, citalopram 5 mg 2 tablets once a day, ferrous sulfate 325 mg twice a day,  fluticasone 50 mcg inhalation 2 sprays once a day, furosemide 40 mg once a day, hydrocodone 1 q. 4 hours p.r.n., isosorbide mononitrate 30 mg once in the morning, loratadine 10 mg once a day, lorazepam 1 mg 1 to 2 times daily, Lovaza 100 mg oral tablet 2 capsules twice a day, metoprolol 25 mg twice a day, montelukast 10 mg once a day, nabumetone 750 mg 2 daily, Nexium 40 mg a day, MiraLAX daily, Nitrostat 0.5 mg sublingual tablet p.r.n., Spiriva 18 mcg inhalation once a day, Toviaz 8 mg once a day, Tramadol oral tablet 50 mg 2 every 6 hours p.r.n., trazodone 100 mg 2 once a day, Ventolin inhaler 2 puffs q. 4 hours p.r.n., verapamil 240 mg once a day, warfarin 10 mg once a day, Zolpidem 10 mg once a day.   ALLERGIES:  1.  CEFUROXIME. 2.  CODEINE. 3.  IODINATED CONTRAST DYE. 4.  DECONSAL 2. 5.  LEVAQUIN. 6.  MINOCIN. 7.  MORPHINE. 8.  PENICILLIN. 9.  PREDNISONE. 10.  REGLAN. 11.  TALWIN NX. 12.  TETRACYCLINE. 13.  THEOPHYLLINE. 14.  "STRONG IODINE."  PHYSICAL EXAMINATION: VITAL SIGNS: Temperature 95, pulse 114, respirations 24 to 52, blood pressure 145/75, pulse ox 95%.  GENERAL: She is an obese, 69 year old Caucasian female on oxygen supplementation, occasional shortness of breath.  HEENT: Normocephalic, atraumatic.  EYES: Anicteric.  NOSE: Septum midline.  OROPHARYNX: Edentulous.  NECK: No thyromegaly.  HEART: Regular rate and rhythm.  LUNGS: Intermittent crackle and wheeze bilaterally.  ABDOMEN: Pendulous pannus. She is nontender, nondistended. Bowel sounds are positive, normoactive. It is not possible to palpate internal organs.  RECTAL: Anorectal examination is deferred.  EXTREMITIES:  No clubbing, cyanosis or edema.  NEUROLOGICAL: Cranial nerves II through XII grossly intact. Muscle strength bilaterally equal and symmetric.   LABORATORY AND DIAGNOSTIC DATA:  From today include the following: She had a glucose of 189, BUN 21, creatinine 0.89, sodium 141, potassium 4.8, chloride  107, bicarbonate 27, calcium 8.5. Hepatic profile showing a total protein of 5.9, albumin 2.3, total bilirubin 0.2, alkaline phosphatase 88, AST 11, ALT 7. She has had cardiac enzymes x 3 with troponin I 0.13, 0.08 and 0.07, respectively. Her last hemogram shows a white count of 6.9, hemoglobin and hematocrit of 8.2/26.3, platelet count 111. MCV is 95.  On admission to the hospital, her INR was  6.0. Today, her INR 3.8 with a pro time of 37.0. She has had blood cultures x 2, no growth for 24 hours. She has had a portable chest film showing heterogenous opacities in the right lung possibly secondary to asymmetric pulmonary edema or infection or an area of mild scarring in the right lung. She had a CT scan of the chest to check for pulmonary embolism, this showing bilateral pneumonia particularly in the right lung base, no pulmonary embolus.   ASSESSMENT:  Atypical chest pain. The patient also with some intermittent recurrent nausea over the past month or 2. The dysphagia symptoms are a question. She denies regurgitating food, however, is  very difficult to ascertain truly what her symptoms are in questioning her from multiple different directions. I believe that this sounds like a transfer dysphagia, however. It has been some long period of time since she has had an upper scope, however, currently she is not a candidate for EGD. She does have a history of gastric bypass surgery as noted above. The patient is on multiple medications and indeed, some intermittent nausea could be related with some of these medications. There is a broad differential diagnosis for this.   RECOMMENDATION:  Would recommend a nonsedated, noninvasive evaluation to include a standard barium swallow as well as a modified barium swallow via speech pathology. I feel at this point in time, she still has a fair amount of problems in regards to her respiratory issues and this should be held until clinically feasible. Will follow with you.      ____________________________ Christena Deem, MD mus:ct D: 01/06/2012 18:39:00 ET T: 01/07/2012 08:03:49 ET JOB#: [id number]  cc: Duane Lope. Judithann Sheen, MD Christena Deem, MD, <Dictator> Christena Deem MD ELECTRONICALLY SIGNED 01/07/2012 17:28

## 2014-04-28 NOTE — Consult Note (Signed)
Chief Complaint:   Subjective/Chief Complaint seen for dysphagia-stable, no chest pain since yesterday.   tolerating po.   VITAL SIGNS/ANCILLARY NOTES: **Vital Signs.:   31-Dec-13 11:27   Vital Signs Type Routine   Temperature Temperature (F) 98   Celsius 36.6   Temperature Source oral   Pulse Pulse 90   Respirations Respirations 20   Systolic BP Systolic BP 481   Diastolic BP (mmHg) Diastolic BP (mmHg) 73   Mean BP 90   Pulse Ox % Pulse Ox % 97   Pulse Ox Activity Level  At rest   Oxygen Delivery 4L   Brief Assessment:   Cardiac Regular    Respiratory occasional wheeze/rhonchi    Gastrointestinal details normal Soft  Nontender  Nondistended  No masses palpable  Bowel sounds normal   Lab Results: Lab:  31-Dec-13 04:25    O2 Saturation (Pulse Ox) 90   FiO2 (Pulse Ox) 0.21 (Result(s) reported on 09 Jan 2012 at 05:12AM.)  Routine Chem:  31-Dec-13 05:06    Glucose, Serum  118   BUN  20   Creatinine (comp) 0.70   Sodium, Serum 141   Potassium, Serum 4.5   Chloride, Serum  109   CO2, Serum 25   Calcium (Total), Serum  7.6   Anion Gap 7   Osmolality (calc) 285   eGFR (African American) >60   eGFR (Non-African American) >60 (eGFR values <39m/min/1.73 m2 may be an indication of chronic kidney disease (CKD). Calculated eGFR is useful in patients with stable renal function. The eGFR calculation will not be reliable in acutely ill patients when serum creatinine is changing rapidly. It is not useful in  patients on dialysis. The eGFR calculation may not be applicable to patients at the low and high extremes of body sizes, pregnant women, and vegetarians.)  Routine Coag:  31-Dec-13 05:06    Prothrombin  26.6   INR 2.4 (INR reference interval applies to patients on anticoagulant therapy. A single INR therapeutic range for coumarins is not optimal for all indications; however, the suggested range for most indications is 2.0 - 3.0. Exceptions to the INR Reference Range  may include: Prosthetic heart valves, acute myocardial infarction, prevention of myocardial infarction, and combinations of aspirin and anticoagulant. The need for a higher or lower target INR must be assessed individually. Reference: The Pharmacology and Management of the Vitamin K  antagonists: the seventh ACCP Conference on Antithrombotic and Thrombolytic Therapy. CYHTMB.3112Sept:126 (3suppl): [id number] A HCT value >55% may artifactually increase the PT.  In one study,  the increase was an average of 25%. Reference:  "Effect on Routine and Special Coagulation Testing Values of Citrate Anticoagulant Adjustment in Patients with High HCT Values." American Journal of Clinical Pathology 2006;126:400-405.)  Routine Hem:  31-Dec-13 05:06    Hematocrit (CBC)  25.6 (Result(s) reported on 09 Jan 2012 at 06:06AM.)   Assessment/Plan:  Assessment/Plan:   Assessment 1) dysphagia-ba sw negative for mass or stricture-limited study due to body habitus, possible mild dysmotility.  MBSS without significant aspiration.  Poor candidate for EGD.  2) CAD/pneumonia-symptoms improving    Plan 1) follow speech therapy recs.  continue daily/bid ppi.  no other gi recs.  Will sign off.   Electronic Signatures: SLoistine Simas(MD)  (Signed 31-Dec-13 14:29)  Authored: Chief Complaint, VITAL SIGNS/ANCILLARY NOTES, Brief Assessment, Lab Results, Assessment/Plan   Last Updated: 31-Dec-13 14:29 by SLoistine Simas(MD)

## 2014-04-28 NOTE — Consult Note (Signed)
Chief Complaint:   Subjective/Chief Complaint seen for possible dysphagia. patient with atypical cp, currently tolerating po.   VITAL SIGNS/ANCILLARY NOTES: **Vital Signs.:   30-Dec-13 16:31   Vital Signs Type Routine   Temperature Temperature (F) 98   Celsius 36.6   Temperature Source Oral   Pulse Pulse 82   Respirations Respirations 20   Systolic BP Systolic BP 106   Diastolic BP (mmHg) Diastolic BP (mmHg) 64   Mean BP 78   Pulse Ox % Pulse Ox % 95   Pulse Ox Activity Level  At rest   Oxygen Delivery 4L   Brief Assessment:   Cardiac Regular    Respiratory wheezing  rhonchi    Gastrointestinal details normal Soft  Nontender  Bowel sounds normal  morbidly obese   Lab Results: Routine Chem:  30-Dec-13 05:25    Result Comment troponin - RESULTS VERIFIED BY REPEAT TESTING.  - c/ann hummel 01/08/12 @0837 sfs  - READ-BACK PROCESS PERFORMED.  Result(s) reported on 08 Jan 2012 at 08:38AM.   Glucose, Serum  142   BUN  24   Creatinine (comp) 0.73   Sodium, Serum 140   Potassium, Serum 4.7   Chloride, Serum 107   CO2, Serum 28   Calcium (Total), Serum  7.8   Anion Gap  5   Osmolality (calc) 286   eGFR (African American) >60   eGFR (Non-African American) >60 (eGFR values <60mL/min/1.73 m2 may be an indication of chronic kidney disease (CKD). Calculated eGFR is useful in patients with stable renal function. The eGFR calculation will not be reliable in acutely ill patients when serum creatinine is changing rapidly. It is not useful in  patients on dialysis. The eGFR calculation may not be applicable to patients at the low and high extremes of body sizes, pregnant women, and vegetarians.)    17:04    Result Comment TROPONIN - RESULTS VERIFIED BY REPEAT TESTING.  - PREVIOUS CALL:01/08/12@0837...TPL  Result(s) reported on 08 Jan 2012 at 06:11PM.  Cardiac:  30-Dec-13 05:25    Troponin I  0.34 (0.00-0.05 0.05 ng/mL or less: NEGATIVE  Repeat testing in 3-6 hrs  if  clinically indicated. >0.05 ng/mL: POTENTIAL  MYOCARDIAL INJURY. Repeat  testing in 3-6 hrs if  clinically indicated. NOTE: An increase or decrease  of 30% or more on serial  testing suggests a  clinically important change)    17:04    Troponin I  0.28 (0.00-0.05 0.05 ng/mL or less: NEGATIVE  Repeat testing in 3-6 hrs  if clinically indicated. >0.05 ng/mL: POTENTIAL  MYOCARDIAL INJURY. Repeat  testing in 3-6 hrs if  clinically indicated. NOTE: An increase or decrease  of 30% or more on serial  testing suggests a  clinically important change)  Routine Coag:  30-Dec-13 05:25    Prothrombin  23.3   INR 2.0 (INR reference interval applies to patients on anticoagulant therapy. A single INR therapeutic range for coumarins is not optimal for all indications; however, the suggested range for most indications is 2.0 - 3.0. Exceptions to the INR Reference Range may include: Prosthetic heart valves, acute myocardial infarction, prevention of myocardial infarction, and combinations of aspirin and anticoagulant. The need for a higher or lower target INR must be assessed individually. Reference: The Pharmacology and Management of the Vitamin K  antagonists: the seventh ACCP Conference on Antithrombotic and Thrombolytic Therapy. Chest.2004 Sept:126 (3suppl): 2045-2335. A HCT value >55% may artifactually increase the PT.  In one study,  the increase was an average of   25%. Reference:  "Effect on Routine and Special Coagulation Testing Values of Citrate Anticoagulant Adjustment in Patients with High HCT Values." American Journal of Clinical Pathology 2006;126:400-405.)  Routine Hem:  30-Dec-13 05:25    WBC (CBC) 5.1   RBC (CBC)  2.94   Hemoglobin (CBC)  9.4   Hematocrit (CBC)  27.5   Platelet Count (CBC) 158   MCV 94   MCH 31.9   MCHC 34.0   RDW 14.5   Neutrophil % 89.7   Lymphocyte % 7.2   Monocyte % 2.7   Eosinophil % 0.1   Basophil % 0.3   Neutrophil # 4.6   Lymphocyte #   0.4   Monocyte #  0.1   Eosinophil # 0.0   Basophil # 0.0 (Result(s) reported on 08 Jan 2012 at 06:24AM.)   Assessment/Plan:  Assessment/Plan:   Assessment 1) chest pain in the settign of known CAD and pneumonia 2) dysphagia-patietn is edentulous. MBSS note with recommendations.    Plan 1) will order standard barium swallow.  Patient is a very poor candidate for sedated proceedure, endoscopy not anticipated.   Electronic Signatures: Skulskie, Martin (MD)  (Signed 30-Dec-13 20:50)  Authored: Chief Complaint, VITAL SIGNS/ANCILLARY NOTES, Brief Assessment, Lab Results, Assessment/Plan   Last Updated: 30-Dec-13 20:50 by Skulskie, Martin (MD) 

## 2014-05-01 NOTE — Consult Note (Signed)
Chief Complaint:  Subjective/Chief Complaint seen for c/o diarrhea, with recent h/o recurrent c diff.  Please see full GI consult and brief consult note.  currently patietn denies n/v or abdominal pain.  No bm since early yesterday.  On po vancomycin.  Review of chart indicates last colonoscopy 08/2010 with finding of diverticulosis, recommended repeat in 5 years.  Continue curretn, will be happy to see patient as o/p in GI followup.   Electronic Signatures: Barnetta ChapelSkulskie, Martin (MD)  (Signed 25-Sep-14 15:16)  Authored: Chief Complaint   Last Updated: 25-Sep-14 15:16 by Barnetta ChapelSkulskie, Martin (MD)

## 2014-05-01 NOTE — Consult Note (Signed)
PATIENT NAME:  Ann Orozco, Ann Orozco MR#:  [id number] DATE OF BIRTH:  16-Oct-1945  DATE OF CONSULTATION:  01/05/2012  CONSULTING PHYSICIAN:  Lamar BlinksBruce J. Terrika Zuver, MD  CONSULTING PHYSICIAN: Dr. Cherlynn KaiserSainani.   REASON FOR CONSULTATION: A 69 year old female with elevated troponin, known coronary artery disease, unstable angina, hyperlipidemia, and pneumonia.   CHIEF COMPLAINT: "I have had chest pain and shortness of breath."   HISTORY OF PRESENT ILLNESS: This is a 69 year old female with known coronary artery disease, hypertension, hyperlipidemia, valvular heart disease, severe COPD, and previous pulmonary embolism who has had new onset of shortness of breath, cough, and congestion with an x-ray consistent with pneumonia. There has been some borderline fever. The patient also has had accelerated episodes of substernal chest discomfort radiating into her back with this shortness of breath. Sublingual (Dictation Anomaly)  nitroglycerin has helped. She does have an elevated troponin of 0.82 with an EKG showing normal sinus rhythm. There is currently no evidence of acute myocardial infarction, and this may be demand ischemia at this time. She is having full relief of chest pain with oxygenation. The patient has had known treatment for all of her cardiovascular risk factors including hyperlipidemia, hypertension with appropriate medications for which she tolerates very well. The patient currently is stable and feeling well.  REVIEW OF SYSTEMS: The remainder review of systems negative for vision change, ringing in the ears, hearing loss, heartburn, nausea, vomiting, diarrhea, bloody stools, stomach pain, extremity pain, leg weakness, cramping of the buttocks, known blood clots, headaches, blackouts, dizzy spells, nosebleeds, congestion, trouble swallowing, frequent urination, urination at night, muscle weakness, numbness, anxiety, depression, skin lesions, or skin rashes.   PAST MEDICAL HISTORY:  1. Hypertension.  2.  Hyperlipidemia.  3. Remote pulmonary embolism. 4. Coronary artery disease with an occluded right coronary artery with collaterals.   FAMILY HISTORY: No family members with early onset of cardiovascular disease or hypertension.   SOCIAL HISTORY: Currently denies alcohol or tobacco use.   ALLERGIES: AS LISTED.   PHYSICAL EXAMINATION:  VITAL SIGNS: Blood pressure 136/68 bilaterally, heart rate 72 upright, reclining, and occasionally irregular.  GENERAL: She is a well appearing female in no acute distress.  HEENT: No icterus, thyromegaly, ulcers, hemorrhage, or xanthelasma.  HEART: Regular rate and rhythm. Normal S1 and S2, distant heart sounds. PMI is diffuse. Carotid upstroke normal without bruit. Jugular venous pressure is normal.  LUNGS: Bibasilar crackles with normal respirations with expiratory wheezing as well as.  ABDOMEN: Soft, nontender. I cannot assess hepatosplenomegaly due to increased abdominal girth.  EXTREMITIES: Showed 2+ radial, femoral, dorsal pedal pulses with trace lower extremity edema. No cyanosis, clubbing, or ulcers.  NEUROLOGIC: She is oriented to time, place, and person with normal mood and affect.   ASSESSMENT: A 69 year old female with acute pneumonia, elevated troponin, hypertension, hyperlipidemia, old pulmonary embolism with unstable angina needing further medical management and treatment options.   RECOMMENDATIONS:  1. Continue serial ECG and enzymes to assess for a possible myocardial infarction.  2. Further antibiotic treatment for pneumonia.  3. Continue following INR which is elevated at this time and hold Coumadin for now for a previous history of pulmonary embolism until INR between 2 to 3. 4. Aspirin for further risk reduction of cardiovascular disease.  5. Echocardiogram for LV systolic dysfunction.       6. Further evaluation as the patient improves with symptoms as to need for further intervention from the cardiac standpoint. Although currently  the patient has occluded right coronary artery with collateralization which she  is being medically managed but possible increasing isosorbide and other nitrates.  ____________________________ Lamar Blinks, MD [email or web address] D: 01/05/2012 09:07:02 ET T: 01/05/2012 11:08:09 ET JOB#: [id number]  cc: Lamar Blinks, MD, <Dictator> Lamar Blinks MD ELECTRONICALLY SIGNED 01/25/2012 8:25

## 2014-05-01 NOTE — Discharge Summary (Signed)
PATIENT NAME:  Ann Orozco, Ann Orozco MR#:  [id number] DATE OF BIRTH:  [birth year]  DATE OF ADMISSION:  01/05/2012 DATE OF DISCHARGE:  01/11/2012  TYPE OF DISCHARGE: The patient is transferred to skilled nursing facility.   REASON FOR ADMISSION: Chest pain with shortness of breath.   HISTORY OF PRESENT ILLNESS: The patient is a 69 year old female with a history of chronic respiratory failure, on oxygen, COPD, morbid obesity, history of pulmonary embolism, on anticoagulation, and known coronary artery disease being medically managed who presented to the Emergency Room with significant chest pain, tightness, shortness of breath, cough and congestion. In the Emergency Room, the patient was more hypoxic than usual. She had known occlusion of the right coronary artery by recent cardiac catheterization. Chest x-ray revealed pneumonia and she was admitted for further evaluation.   PAST MEDICAL HISTORY: 1. Sleep apnea, on CPAP.  2. Morbid obesity.  3. History of pulmonary embolism, on anticoagulation.  4. Single vessel coronary artery disease being medically managed. 5. Benign hypertension.  6. Depression.  7. Osteoarthritis.  8. Hyperlipidemia.  9. Degenerative disk disease.  10. Gout.  11. Osteoarthritis. 12. History of multiple abdominal surgeries.   MEDICATIONS ON ADMISSION: Please see admission note.   ALLERGIES: REGLAN, TALWIN, TETRACYCLINE, CODEINE, MYOSIN, LEVAQUIN, CONTRAST DYE AND MORPHINE.   SOCIAL HISTORY: The patient quit smoking four years ago. No history of alcohol abuse.   FAMILY HISTORY: Positive for Alzheimer's dementia, coronary artery disease and hypertension.   REVIEW OF SYSTEMS: As per HPI admission note.   PHYSICAL EXAM: The patient was in no acute distress. Vital signs were stable and she was afebrile. HEENT exam was unremarkable. Neck was supple without JVD. Lungs revealed scattered wheezes and rales. There is tenderness to palpation along the chest wall. Cardiac exam  revealed a regular rate and rhythm with normal S1 and S2. No significant murmurs. Abdomen was soft and nontender with normoactive bowel sounds. No organomegaly or masses were appreciated. No hernias or bruits were noted. Extremities were without clubbing, cyanosis or edema. Pulses were 2+ bilaterally. Skin was warm and dry without rash or lesions. Neurologic examination revealed cranial nerves II through XII grossly intact. Deep tendon reflexes were symmetric. Motor and sensory examination was nonfocal. Psych examination revealed a patient who was  alert and oriented to person, place and time. She was cooperative and used good judgment.  HOSPITAL COURSE: The patient was admitted with chest pain, shortness of breath, cough and pneumonia. She was admitted to the intensive care unit where she continued to complain of chest pain. She had a mild bump in her troponin. She was seen by cardiology who felt that the troponin bump was due to demand ischemia and not myocardial infarction. CT of the chest revealed bilateral pneumonia. She was also seen in consultation by pulmonology who recommended conservative therapy. She was placed on IV steroids with IV antibiotics and SVNs with her oxygen. Eventually, the patient's pain and shortness of breath improved. Initially, she was coagulopathic with an INR of 6. Her Coumadin was held until it returned to therapeutic ranges. Her Coumadin was subsequently restarted at that time. After that, the patient remained stable on Coumadin. There was no active evidence of bleeding. Her pain improved. She was weaned down to 2 liters of oxygen. Chest x-ray showed improvement as well. She remained weak. She was seen by physical therapy who recommended placement for rehab. The patient agreed. She is now transferred to the skilled nursing facility for further care and rehabilitation.  DISCHARGE DIAGNOSES: 1. Bilateral pneumonia.  2. Acute on chronic respiratory failure.  3. Chronic  obstructive pulmonary disease exacerbation.  4. History of pulmonary embolism on anticoagulation.  5. Single vessel coronary artery disease being medically managed.  6. Elevated troponin due to demand ischemia.  7. Chronic anemia.  8. Sleep apnea on CPAP.  9. Morbid obesity. 10. Benign hypertension.  11. Hyperlipidemia.  12. Gout.  13. Degenerative disk disease. 14. Depression.   DISCHARGE MEDICATIONS: 1. Oxygen at 2 liters per minute per nasal cannula.  2. Norco 10/325 mg 1 p.o. every 4 hours p.r.n. pain.  3. Allopurinol 300 mg p.o. daily.  4. Aspirin 81 mg p.o. daily.  5. Lipitor 20 mg p.o. at bedtime.  6. Sinemet 25/100 mg 2 p.o. 3 times daily. 7. Iron sulfate 325 mg p.o. 2 times a day. 8. Advair 500/50 one puff 2 times a day. 9. Lorazepam 1 mg p.o. every 4 hours p.r.n. anxiety.  10. Singulair 10 mg p.o. daily. 11. Nitrostat 0.4 mg sublingually p.r.n. chest pain.  12. Daliresp 500 mcg p.o. daily.  13. Lovaza 1 gram p.o. daily.  14. Zofran 4 mg p.o. q. 4 hours p.r.n. nausea and vomiting.  15. MiraLax 17 grams p.o. daily as needed for constipation.  16. Spiriva 1 capsule inhaled daily.  17. Trazodone 100 mg p.o. at bedtime.  18. Coreg 3.125 mg p.o. 2 times a day. 19. Duragesic 25 mcg topically q. three days.  20. Cymbalta 30 mg p.o. daily.  21. Sliding scale insulin as directed.  22. Nitro-Dur patch 0.4 mg topically daily.  23. Coumadin 7.5 mg p.o. q. p.m.  24. Imdur 60 mg p.o. daily.  25. Protonix 40 mg p.o. 2 times a day. 26. Clindamycin 300 mg p.o. q. 8 hours x 1 week.  27. Prednisone taper as directed.  28. Septra DS 1 p.o. 2 times a day x 1 week.  29. DuoNeb SVNs q. 4 hours while awake.   FOLLOW-UP PLANS AND APPOINTMENTS: The patient will be followed by the resident physician at the skilled nursing facility. She is on an 1800 calorie 2 gram sodium diet. She will be seen by physical therapy. She will follow up with me in the office in 2 weeks, sooner if needed.   ____________________________ Duane LopeJeffrey D. Judithann SheenSparks, MD jds:sb D: 01/11/2012 07:30:00 ET T: 01/11/2012 07:44:42 ET JOB#: [id number]  cc: Duane LopeJeffrey D. Judithann SheenSparks, MD, <Dictator> Jarl Sellitto Rodena Medin Abeer Iversen MD ELECTRONICALLY SIGNED 01/11/2012 8:12

## 2014-05-01 NOTE — Discharge Summary (Signed)
PATIENT NAME:  Ann Orozco, Ann Orozco MR#:  [id number] DATE OF BIRTH:  1945/04/28  DATE OF ADMISSION:  11/21/2012 DATE OF DISCHARGE: 11/25/2012   TYPE OF DISCHARGE: The patient is transferred to skilled nursing facility.   REASON FOR ADMISSION: Acute on chronic respiratory failure.   HISTORY OF PRESENT ILLNESS: The patient is a 69 year old female with a history of morbid obesity, COPD and recurrent PEs on chronic anticoagulation. She also has obstructive sleep apnea on CPAP. She presented to the Emergency Room with worsening shortness of breath. She was found to be more hypoxic, in moderate respiratory distress and was admitted for further evaluation.   PAST MEDICAL HISTORY:  1.  Benign hypertension.  2.  Hyperlipidemia.  3.  Obstructive sleep apnea on CPAP. 4.  Recurrent PEs on chronic anticoagulation.  5.  Morbid obesity.  6.  Depression.  7.  Osteoarthritis.  8.  Chronic pain.  9.  Degenerative disk disease.  10. Gout.  11. ASCVD.  12. Status post multiple abdominal surgeries.   MEDICATIONS ON ADMISSION: Please see admission note.   ALLERGIES: THEOPHYLLINE, TALWIN, CODEINE, TETRACYCLINE, LEVAQUIN, REGLAN, MORPHINE, PENICILLIN, CONTRAST DYE, MINOCIN.   SOCIAL HISTORY: The patient quit smoking 5 years ago. No history of alcohol abuse.   FAMILY HISTORY: Positive for Alzheimer's dementia, coronary artery disease and hypertension.   REVIEW OF SYSTEMS: As per admission note.   PHYSICAL EXAM:  GENERAL: The patient was in no acute distress.  VITAL SIGNS: Stable and she was afebrile.  HEENT: Unremarkable.  NECK: Supple without JVD.  LUNGS: Coarse breath sounds with bilateral wheezes.  CARDIAC: Reveals a rapid rate with a regular rhythm. Normal S1, S2.  ABDOMEN: Soft and nontender.  EXTREMITIES: Without edema. NEUROLOGIC: Grossly nonfocal.   HOSPITAL COURSE: The patient was admitted with acute on chronic respiratory failure with COPD exacerbation. The patient was maintained on  Coumadin given her history of pulmonary emboli. Her Coumadin dose was adjusted until her INR was therapeutic. The patient was initially placed on IV steroids with IV antibiotics, but improved. She was seen in consultation by Dr. Raul Del of pulmonology, who recommended conservative care. Her chest x-ray showed stabilization and actually some improvement with therapy. She was switched from IV antibiotics and IV steroids to oral antibiotics and prednisone. Placement was recommended. She was seen by physical therapy. The patient agreed to placement and is now transferred to the skilled nursing facility for further care and rehabilitation.   DISCHARGE DIAGNOSES:  1.  Acute on chronic respiratory failure.  2.  Right lower lobe pneumonia.  3.  Chronic obstructive pulmonary disease exacerbation.  4.  Morbid obesity.  5.  Recurrent pulmonary embolisms on chronic anticoagulation.  6.  Depression.  7.  Chronic pain.  8.  Osteoarthritis.  9.  Atherosclerotic cardiovascular disease.  10. Hyperlipidemia.  11. Obstructive sleep apnea, on CPAP.   DISCHARGE MEDICATIONS:  1.  Zithromax 500 mg p.o. daily for 5 days.  2.  Prednisone taper as directed.  3.  Ambien 5 mg p.o. at bedtime.  4.  Coumadin 6 mg p.o. q.p.m.  5.  Trazodone 200 mg p.o. at bedtime.  6.  Toviaz 8 mg p.o. daily.  7.  Spiriva 1 capsule inhaled daily.  8.  Ranexa 1000 mg p.o. b.i.d.  9.  Protonix 40 mg p.o. daily.  10. Nitrostat 0.4 mg sublingually p.r.n. chest pain. 11. Relafen 500 mg p.o. b.i.d.  12. Singulair 10 mg p.o. daily.  13. Ativan 1 mg p.o. q.4 hours p.r.n. anxiety.  14. Lipitor 20 mg p.o. daily.  15. Imdur 120 mg p.o. b.i.d.  16. Daliresp 500 mg p.o. daily.  17. Claritin 10 mg p.o. daily.  18. Sinemet 25/100 mg 1 p.o. t.i.d.  19. Symbicort 160/4.5 mg 2 puffs b.i.d.  20. Aspirin 81 mg p.o. daily.  21. Allopurinol 300 mg p.o. daily.  22. DuoNeb SVNs q.4 hours while awake.  23. Norco 10/325 mg 1 p.o. q.4 hours p.r.n.  pain.  24. Oxygen at 3 L per minute per nasal cannula.   FOLLOW-UP PLANS AND APPOINTMENTS: The patient was discharged on a low sodium diet. She is a full code. She will be followed by the resident physician at the skilled nursing facility. We will obtain a CBC and a MET-B and a pro time in 1 week. She will be seen in consultation by physical therapy there for ambulation and strengthening.   TOTAL TIME SPENT ON THIS PATIENTS DISCHARGE: 50 minutes.  ____________________________ Leonie Douglas Doy Hutching, MD jds:aw D: 11/25/2012 07:54:20 ET T: 11/25/2012 08:10:22 ET JOB#: [id number]  cc: Leonie Douglas. Doy Hutching, MD, <Dictator> Hiren Peplinski Lennice Sites MD ELECTRONICALLY SIGNED 11/25/2012 12:05

## 2014-05-01 NOTE — Consult Note (Signed)
Brief Consult Note: Diagnosis: c-diff.   Patient was seen by consultant.   Consult note dictated.   Comments: Appreciate consult for [age] y /o caucasian woman with complex health history admitted with diarrhea and c-diff colitis for evaluation of same. Patient was treated a few weeks ago for c-diff with flagyl 250mg  po qid x 10- seemed to get better, then had relapse of diarrhea and tested positive for c-diff. Vancomycin 250mg  po qid was [facility] approx 9d ago [facility]- patient called in clinic with recurrent diarrhea, feeling unwell yesterday and ultimately was instructed to go to ED, and was admitted. She is currently receiving IVF and Vancomycin 500mg  po qid. States she is feeling very much better than yesterday and has had no further diarrhea while here. She is afebrile and says she is really not having any signficant abdominal pain. In further discussion with patient about household practices and c-diff, it appears that although her husband in cleaning surfaces with bleach and dishes are being washed appropriately, the patient has been using hand wipes to clean hands after defecating instead of soap/water.  No further GI complaints. Impression and plan: C-diff diarrhea. Much better presently. Continue current. Do note anemia- unsure of this result, difficulty to believe patient lost 4g hgb overnight without any signs of bleeding, will repeat this in am.  Electronic Signatures: Vevelyn PatLondon, Steve Youngberg H (NP)  (Signed 25-Sep-14 13:12)  Authored: Brief Consult Note   Last Updated: 25-Sep-14 13:12 by Keturah BarreLondon, Edrick Whitehorn H (NP)

## 2014-05-01 NOTE — H&P (Signed)
PATIENT NAME:  Ann Orozco, Ann Orozco MR#:  [id number] DATE OF BIRTH:  1945-01-31  DATE OF ADMISSION:  11/21/2012  PRIMARY CARE PHYSICIAN: Duane Lope. Judithann Sheen, MD  REFERRING PHYSICIAN: [place] Sink. Dolores Frame, MD  CHIEF COMPLAINT: Shortness of breath.   HISTORY OF PRESENT ILLNESS: Ann Orozco is a 69 year old obese white female with a past medical history of hypertension, hyperlipidemia, COPD, home oxygen dependent, coronary artery disease, obstructive sleep apnea, on CPAP, pulmonary embolism, on chronic anticoagulation, who presented to the Emergency Department with complaints of cough and shortness of breath for the last 10 days. The patient states has been coughing, unable to bring up the sputum. This was associated with shortness of breath. The patient has been producing greenish-yellow sputum. Did not have any fever; however, this morning, experienced some chills. The patient woke up this morning with severe shortness of breath. Concerning this, called EMS and was brought to the Emergency Department. On workup in the Emergency Department, the patient was diffusely wheezing. The patient received multiple breathing treatments, then started to notice more wheezing. The patient received Rocephin in the Emergency Department, which caused her to itch. The patient is currently receiving Benadryl.   PAST MEDICAL HISTORY:  1. Hypertension.  2. Hyperlipidemia.  3. Obstructive sleep apnea, on CPAP.  4. Pulmonary embolism, on chronic anticoagulation.  5. Morbid obesity.  6. Depression.  7. Osteoarthritis.  8. Chronic pain syndrome.  9. Degenerative disk disease of the spine.  10. Gout.  11. Coronary artery disease.    PAST SURGICAL HISTORY:  1. Multiple abdominal surgeries.  2. Hiatal hernia.    ALLERGIES: THE PATIENT HAS MULTIPLE ALLERGIES. ALLERGIES LISTED: 1. THEOPHYLLINE.  2. PREDNISONE.  3. TALWIN. 4. CODEINE.  5. TETRACYCLINE.  6. LEVAQUIN.  7. REGLAN.  8. MORPHINE.  9. PENICILLIN.  10.  CONTRAST IODINATED.  11. MINOCIN.  12. DECONSAL.   HOME MEDICATIONS:  1. Ambien 10 mg once a day.  2. Coumadin 6 mg once a day.  3. Trazodone 100 mg once a day.  4. Toviaz 8 mg once a day.  5. Spiriva 18 mcg once a day.  6. Ranolazine 1000 mg 2 times a day.  7. Phenergan 25 mg every 6 hours as needed.  8. ProAir 2 puffs every 2 hours as needed.  9. Protonix 40 mg once a day.  10. Nitrostat 0.4 mg tablets sublingual every 5 minutes as needed.  11. Nabumetone 750 mg 2 times a day.  12. Montelukast 25 mg 2 times a day.  13. Ativan 1 mg every 4 hours as needed.  14. Lipitor 20 mg once a day.  15. Imdur 120 mg extended release 1 tablet 2 times a day.  16. Fosamax 70 mg once a week.  17. Daliresp 500 mcg once a day.  18. Claritin 10 mg once a day.  19. Carbidopa/levodopa 25/100 mg 1 tablet 4 times a day.  20. . 21. Advair. 22. Symbicort 2 puffs 2 times a day.  23. Aspirin 81 mg once a day.  24. Aripiprazole 5 mg once a day.  25. Allopurinol 300 mg once a day.  26. Combivent.  27. DuoNebs 4 times a day.  28. Norco 10/325 mg every 4 hours as needed.   SOCIAL HISTORY: Former smoker, quit in 2009. Denies drinking alcohol or using illicit drugs. Currently lives with her husband. Has poor functional status; however, independent of ADLs.   FAMILY HISTORY: Positive for hypertension, Alzheimer's disease and coronary artery disease.  REVIEW OF SYSTEMS:  CONSTITUTIONAL:  States generalized weakness.  EYES: No change in vision.  ENT: No change in hearing.  RESPIRATORY: Has cough, shortness of breath.  CARDIOVASCULAR: No chest pain, palpitations.  GASTROINTESTINAL: No nausea, vomiting, abdominal pain.  GENITOURINARY: No dysuria or hematuria.  ENDOCRINE: No polyuria or polydipsia.  HEMATOLOGIC: No easy bruising or bleeding.  SKIN: No rashes or lesions.  MUSCULOSKELETAL: Has history of osteoarthritis.  NEUROLOGIC: No weakness or numbness in any part of the body.   PHYSICAL EXAMINATION:   GENERAL: This is a well-built, well-nourished obese female who looks somewhat older than her stated age, lying down in the bed, not in distress.  VITAL SIGNS: Temperature 98, pulse 112, blood pressure 106/56, respiratory rate of 28, oxygen saturation is 99% on 3 liters of oxygen.  HEENT: Head normocephalic, atraumatic. Eyes: No scleral icterus. Conjunctivae normal. Pupils equal and reactive to light. Extraocular movements are intact. Mucous membranes moist. No pharyngeal erythema.  NECK: Supple. No lymphadenopathy. No JVD. No carotid bruit.  CHEST: Has no focal tenderness.  LUNGS: Has bilateral coarse breath sounds, bilateral wheezing appreciated.  HEART: S1, S2, regular, tachycardia.  ABDOMEN: Bowel sounds plus. Soft, nontender, nondistended. Obese abdomen. Large pannus. Could not appreciate any hepatosplenomegaly.  EXTREMITIES: No pedal edema. Pulses 2+.  NEUROLOGIC: The patient is alert, oriented to place, person and time. Cranial nerves II through XII intact. Motor 5/5 in upper and lower extremities.  SKIN: No rash or lesions.  MUSCULOSKELETAL: Good range of motion in all the extremities.   LABORATORY DATA: BMP is completely within normal limits. Cardiac enzymes are well within normal limits. Coag profile: PT 17, INR of 1.4. Troponin less than 0.02. Chest x-ray, PA and lateral, shows stable right basal airspace opacification with associated bronchiectasis, reflects chronic sequelae of infection.   ASSESSMENT AND PLAN: Ann Orozco is a 69 year old female who comes to the Emergency Department with complaints of cough, shortness of breath, diagnosed with chronic obstructive pulmonary disease exacerbation.   1. Chronic obstructive pulmonary disease exacerbation. The patient states has greenish sputum. Start the patient on antibiotics. The patient just developed itching to Rocephin. Will hold the Rocephin. Will continue with the Zithromax. The patient does not have any elevated white blood cell  count. Afebrile. Will continue with the Solu-Medrol and breathing treatments.  2. History of atrial fibrillation. The patient is tachycardic. Will start on metoprolol 25 mg b.i.d. If rate is not controlled, will change to q.6 hours.  3. Obesity. Counseled with the patient regarding diet and exercise.  4. Debility. Will involve the physical therapy, occupational therapy.  5. History of pulmonary embolism. The patient's INR is 1.4. Will continue with Coumadin and follow up.  6. Keep the patient on deep vein thrombosis prophylaxis with Lovenox.   TIME SPENT: 45 minutes.   ____________________________ Susa GriffinsPadmaja Wadie Mattie, MD pv:lb D: 11/21/2012 07:20:15 ET T: 11/21/2012 07:57:52 ET JOB#: [id number]  cc: Susa GriffinsPadmaja Ezme Duch, MD, <Dictator> Duane LopeJeffrey D. Judithann SheenSparks, MD Susa GriffinsPADMAJA Shaniqwa Horsman MD ELECTRONICALLY SIGNED 12/08/2012 0:36

## 2014-05-01 NOTE — Consult Note (Signed)
0PATIENT NAME:  Ann ChurchesDUCKWORTH, Breann M MR#:  [id number] DATE OF BIRTH:  [birth year]  DATE OF CONSULTATION:  01/07/2012  CONSULTING PHYSICIAN:  Haidan Nhan K. Sutton Plake, MD  CHIEF COMPLAINT:  "I came here for my chest pain and for my pneumonia".    HISTORY OF PRESENT ILLNESS:  The patient is a 69 year old white female, not employed and is married.  She is on disability for multiple physical problems.  She is married and lives with her husband.  The patient reports that she has depression, which is long-standing and is [facility] by Dr. Rogers BlockerAhluwalia.  Her last appointment was on 01/01/2012 and next appointment with him is coming up on 01/29/2012.    PAST PSYCHIATRIC HISTORY:  No previous history of __Inpt Hospitalization to  psychiatry.  Long history of depression and currently, the doctor at [facility] started her on Celexa 10 mg p.o. daily and gave her a return appointment on 01/29/2012, at which time he will reevaluate the dose of the medicine.  Alcohol and drugs: Denied.    MENTAL STATUS:  The patient is seen, sitting comfortably in bed. Alert and oriented to place, person and time.  Fully aware of situation and that [facility].  Admits feeling depressed, low and down, but denies feeling hopeless or helpless, denies feeling worthless or  useless. Denies suicidal thought or threat.  No psychosis.  Denies any auditory or visual hallucinations.  Cognition is grossly intact.  General knowledge of information is fair. Memory is intact.  Denies any ideas or plan to hurt herself or others.  Insight and judgment are fair.  IMPRESSION: Depressive disorder, not otherwise specified.    RECOMMENDATIONS:  Continue the medication that [facility], which is Celexa 10 mg p.o. daily. You can increase up to 20 mg p.o. daily and then discharge.  The patient will keep her followup appointment on 01/29/2012 with [facility].   ____________________________ Jannet MantisSurya K. Guss Bundehalla,  MD skc:eg D: 01/07/2012 17:48:00 ET T: 01/07/2012 21:40:00 ET JOB#: [id number]  cc: Monika SalkSurya K. Guss [place]unde[place], MD, <Dictator> Beau FannySURYA K Leor Whyte MD ELECTRONICALLY SIGNED 01/10/2012 20:47

## 2014-05-01 NOTE — Consult Note (Signed)
History of Present Illness:  Reason for Consult anemia and thrombocytopenia   HPI   This is a 69 year old female with multiple comorbidities including morbid obesity, hypertension, COPD on home O2, coronary artery disease, obstructive sleep apnea on CPAP, pulmonary embolism on chronic anticoagulation, who has had diarrhea off and on now for the last month. She initially was seen in her early August in clinic and had a folliculitis. She received clindamycin for this, however, she shortly developed diarrhea. She stopped the antibiotic but the diarrhea continued. She had a C. diff. test on August 14th which was positive. She started treatment then although she is unclear if she was taking vancomycin or Flagyl. Repeat C. diff. September 12th showed continued positive due to the persistent diarrhea. She was switched to vancomycin, she thinks 250 q.i.d. She has been on that and has 2 more tablets left. However, her diarrhea is quite persistent and she has been unable to keep up with it. Every time she eats or drinks she has a large liquid stool.any hematemesis or melena.  PFSH:  Family History positive, HT   Social History negative alcohol, quit smoking in 2009   Review of Systems:  General denies complaints   Performance Status (ECOG) 2   HEENT no complaints   Lungs no complaints   Cardiac no complaints   GI diarrhea   GU no complaints   Musculoskeletal no complaints   Extremities no complaints   Skin no complaints   Neuro no complaints   Endocrine no complaints   Psych no complaints   NURSING NOTES: ED Vital Sign Flow Sheet:   24-Sep-14 20:03   Pulse Pulse: 88   Respirations Respirations: 18   SBP SBP: 107   DBP DBP: 65   Pulse Ox % Pulse Ox %: 98   Pulse Ox Source Source: Oxygen; 2 liters   Pain Scale (0-10) Pain Scale (0-10): Scale:6   Cardiac Monitor On: Yes   Physical Exam:  General pleasant female sitting up in chair in no acute distress   HEENT:  normal   Lungs: clear   Cardiac: regular rate, rhythm   Breast: not examined   Abdomen: soft  positive bowel sounds   Skin: intact   Extremities: No edema, rash or cyanosis   Neuro: AAOx3   Psych: normal appearance    Theophylline: Alt Ment Status  Prednisone: Alt Ment Status  Levaquin: Hives  Morphine: Itching  Reglan: Hives  Talwin NX: GI Distress  Codeine: Hives  Tetracycline: Hives  Iodine Strong: Unknown  Minocin: Unknown  Penicillin: Rash, Itching  Contrast - Iodinated Radiocontrast Dye: Other  Deconsal II: Unknown  Laboratory Results:  Lab:  25-Sep-14 12:00   O2 Saturation (Pulse Ox) 90  FiO2 (Pulse Ox) 0.21 (Result(s) reported on 03 Oct 2012 at 01:16PM.)  Routine Chem:  25-Sep-14 04:18   Iron Binding Capacity (TIBC)  136  Unbound Iron Binding Capacity 74  Iron, Serum 62  Iron Saturation 46 (Result(s) reported on 03 Oct 2012 at 01:05PM.)  Ferritin ([facility]) 209 (Result(s) reported on 03 Oct 2012 at 01:12PM.)  Glucose, Serum 78  BUN  19  Creatinine (comp) 1.14  Sodium, Serum 139  Potassium, Serum 4.2  Chloride, Serum 105  CO2, Serum 29  Calcium (Total), Serum  7.6  Anion Gap  5  Osmolality (calc) 279  eGFR (African American)  58  eGFR (Non-African American)  50 (eGFR values <89m/min/1.73 m2 may be an indication of chronic kidney disease (CKD). Calculated eGFR is useful in  patients with stable renal function. The eGFR calculation will not be reliable in acutely ill patients when serum creatinine is changing rapidly. It is not useful in  patients on dialysis. The eGFR calculation may not be applicable to patients at the low and high extremes of body sizes, pregnant women, and vegetarians.)  Routine Coag:  25-Sep-14 04:18   Prothrombin  22.3  INR 2.0 (INR reference interval applies to patients on anticoagulant therapy. A single INR therapeutic range for coumarins is not optimal for all indications; however, the suggested range for most indications  is 2.0 - 3.0. Exceptions to the INR Reference Range may include: Prosthetic heart valves, acute myocardial infarction, prevention of myocardial infarction, and combinations of aspirin and anticoagulant. The need for a higher or lower target INR must be assessed individually. Reference: The Pharmacology and Management of the Vitamin K  antagonists: the seventh ACCP Conference on Antithrombotic and Thrombolytic Therapy. YBWLS.9373 Sept:126 (3suppl): [id number]. A HCT value >55% may artifactually increase the PT.  In one study,  the increase was an average of 25%. Reference:  "Effect on Routine and Special Coagulation Testing Values of Citrate Anticoagulant Adjustment in Patients with High HCT Values." American Journal of Clinical Pathology 2006;126:400-405.)  Routine Hem:  25-Sep-14 04:18   WBC (CBC) 3.9  RBC (CBC)  2.73  Hemoglobin (CBC)  8.9  Hematocrit (CBC)  26.5  Platelet Count (CBC)  134  MCV 97  MCH 32.8  MCHC 33.7  RDW  15.1  Neutrophil % 66.8  Lymphocyte % 26.4  Monocyte % 5.4  Eosinophil % 1.0  Basophil % 0.4  Neutrophil # 2.6  Lymphocyte # 1.0  Monocyte # 0.2  Eosinophil # 0.0  Basophil # 0.0 (Result(s) reported on 03 Oct 2012 at 05:19AM.)   Assessment and Plan: Impression:   69 Year old female with C diificile diarrhea.and thrombocytopenia may be related to infection as well as medication Plan:   Anemia and thrombocytopenia- May be secondary to infection as well as antibiotic therapycheck LDH and Bilirubin.check Iron studiesfollow.  Electronic Signatures: Georges Mouse (MD)  (Signed 25-Sep-14 13:32)  Authored: HISTORY OF PRESENT ILLNESS, PFSH, ROS, NURSING NOTES, PE, ALLERGIES, HOME MEDICATIONS, LABS, ASSESSMENT AND PLAN   Last Updated: 25-Sep-14 13:32 by Georges Mouse (MD)

## 2014-05-01 NOTE — H&P (Signed)
PATIENT NAME:  Ann Orozco, Ann Orozco MR#:  [id number] DATE OF BIRTH:  01/09/1946  DATE OF ADMISSION:  07/18/2012  PRIMARY CARE PHYSICIAN:  Dr. Aram Beecham.   REFERRING PHYSICIAN:  Dr. Manson Passey.   CHIEF COMPLAINT:  Chest pain and shaking in the arms.   HISTORY OF PRESENT ILLNESS:  The patient is a 69 year old morbidly obese female with a history of hypertension, COPD, oxygen dependent, coronary artery disease, had a heart catheter in November 2013 showing the complete right RCA occlusion, recommended medical management, history of pulmonary embolism on chronic anticoagulation, obstructive sleep apnea on CPAP, presented to the Emergency Department with complaints of increased tremors, severe generalized weakness and chest pain.   The patient states has some tremors at baseline, however has been experiencing severe generalized weakness, unable to walk.  Two hours prior coming to the Emergency Department, also experienced chest pain on the left side of the chest.  The patient is a poor historian.  Work-up in the Emergency Department with EKG, cardiac enzymes was unremarkable.  The patient is found to have urinary tract infection.  There was also concern about pneumonia on the chest x-ray.  However, the patient has normal white blood cell count, is afebrile, does not have any productive sputum.  The patient is also found to have somewhat low-normal blood pressure, systolic blood pressure in the 90s.  The patient states recently cut down on her blood pressure medication; however, continued to have somewhat low-normal blood pressure.   PAST MEDICAL HISTORY: 1.  Hypertension.  2.  Hyperlipidemia.  3.  Obstructive sleep apnea, on CPAP.  4.  History of pulmonary embolism, on chronic anticoagulation.  5.  Morbid obesity.  6.  Depression.  7.  Osteoarthritis.  8.  Chronic pain syndrome.  9.  Degenerative disk disease of the spine.  10.  Gout.  11.  Coronary artery disease with a recent heart cath by Dr.  Gwen Pounds showing right coronary artery disease with good collaterals, recommended medical management.   PAST SURGICAL HISTORY: 1.  Multiple abdominal surgeries.  2.  Hernia surgery.   ALLERGIES:   1.  REGLAN.   2.  TALWIN. 3.  TETRACYCLINE.  4.  CODEINE.  5.  MINOCIN.  6.  LEVAQUIN.  7.  CONTRAST DYE.  8.  MORPHINE.   HOME MEDICATIONS:  The patient has polypharmacy.  1.  Zolpidem 10 mg once a day.  2.  Warfarin 7.5 mg once a day.  3.  VESIcare 10 mg once a day.  4.  Verapamil 180 mg every 24 hours.  5.  Trazodone 200 mg at bedtime.  6.  Spiriva 18 mcg 1 puff once a day.  7.  Ranexa 500 mg 2 times a day.  8.  ProAir 1 puff 2 times a day.  9.  MiraLAX 17 grams 2 times a day.  10.  Zofran 4 mg 2 times a day.  11.  Nitrostat as needed.  12.  Nexium 40 mg once daily.  13.  Nabumetone 750 mg 2 times a day.  14.  Montelukast 10 mg once a day.  15.  Metoprolol 25 mg 2 times a day.  16.  Lorazepam 1 mg 2 times a day.  17.  Loratadine 10 mg once a day.  18.  Imdur 60 mg 2 times a day.  19.  Lasix 40 mg once a day.  20.  Fluticasone 50 mcg once a day. 21.  Fish oil 1000 mg once a day.  22.  Ferrous  sulfate 325 mg 2 times a day.  23.  Escitalopram 10 mg 2 times a day.  24.  Daliresp 500 mcg oral 1 tablet once a day.  25.  Carbidopa/levodopa 50/200 mg extended release 4 times a day.  26.  Atorvastatin 20 mg once a day.  27.  Aspirin 81 mg once a day.  28.  Allopurinol 300 mg once a day.  29.  Alendronate 70 mg once a week.  30.  Advair Diskus 1 puff 2 times a day.  31.  Hydrocodone/acetaminophen 10/325 mg 2 times a day.  32.  Abilify 5 mg 1 tablet once a day.   SOCIAL HISTORY:  Quit smoking in 2009.  Denies drinking alcohol or using illicit drugs.  Married, lives with her husband.  The patient is at baseline, walks in the house a few steps, mostly dependent on her husband with ADLs.   FAMILY HISTORY:  Significant for hypertension, Alzheimer disease and coronary artery disease.    REVIEW OF SYSTEMS: CONSTITUTIONAL:  Has generalized weakness, fatigue.  EYES:  No change in vision.  EARS, NOSE, THROAT:  No change in hearing.  No sore throat.  RESPIRATORY:  No cough.  No shortness of breath.  CARDIOVASCULAR:  Has chest pain on the left side of the chest.  No pedal edema.  No palpitations.  GASTROINTESTINAL:  Has good appetite and no nausea, vomiting or abdominal pain.  GENITOURINARY:  No dysuria or hematuria.  SKIN:  No rash or lesions.  HEMATOLOGIC:  No easy bruising or bleeding.  MUSCULOSKELETAL:  Has generalized body aches.  NEUROLOGIC:  No weakness or numbness in any part of the body.   PHYSICAL EXAMINATION: GENERAL:  This is a well-built, well-nourished, morbidly obese female, looks much older than her stated age, lying down in the bed, not in distress.  VITAL SIGNS:  Temperature 97.7, pulse 83, blood pressure 92/64, respiratory rate of 16, oxygen saturation is 98% on 3 liters of oxygen.  HEENT:  Head normocephalic, atraumatic.  Eyes: No sclerae icterus.  Conjunctivae normal.  Pupils equal and react to light.  Mucous membranes dry.  No pharyngeal erythema.  NECK:  Supple.  No lymphadenopathy.  No JVD.  No carotid bruit.  No thyromegaly.  CHEST:  Has no focal tenderness.  Has bibasilar fine crackles heard bilaterally equally.  HEART:  S1 and S2, regular.  No murmurs are heard.  No pedal edema.  Pulses 2+.  ABDOMEN:  Obese.  Bowel sounds plus.  Soft, nontender, nondistended.  Could not appreciate any hepatosplenomegaly.  SKIN:  Dry skin.  No rash or lesions.  MUSCULOSKELETAL:  Good range of motion in all the extremities.  NEUROLOGIC:  The patient is somewhat somnolent, however oriented to place, person and time.  Cranial nerves II through XII intact.  Motor 5/5 in upper and lower extremities.   LABORATORY DATA:  UA shows positive for nitrites, 1+ leukocyte esterase, WBC of 35, 3+ bacteria.  Troponins are negative.  BNP is 350.  CMP: BUN 28, creatinine of 1.2.  The  rest of the values are within normal limits.  CBC: WBC of 5.1, hemoglobin 11.3, platelet count of 174.   Coag profile: PT is 27, INR of 2.16.   EKG: 12-lead, normal sinus rhythm with no ST-T wave abnormalities.   ASSESSMENT AND PLAN:  The patient is a 69 year old female who comes to the Emergency Department with generalized weakness, increased tremors and chest pain.  1.  Generalized weakness, concern about from the hypotension.  The  patient is on polypharmacy and multiple sedative medications and blood pressure medications.  We will hold the verapamil and metoprolol until the blood pressure improves, greater than 130.  Also, dehydration might be contributing to this.  We will gently hydrate the patient.  The patient also has urinary tract infection also could be contributing to this.  2.  Chest pain.  The patient has underlying coronary artery disease with good collaterals.  Recommended medical management; however, initial set of cardiac enzymes are negative.  The patient's low blood pressure also could be causing this chest pain.  We will continue with Imdur for now.  3.  Urinary tract infection.  We will obtain urine cultures and start the patient on Rocephin.  4.  Debility.  We will involve physical therapy.  5.  Chronic obstructive pulmonary disease.  The patient does not have any current issues.  Continue with inhalers as needed.  6.  Chronic pain syndrome.  The patient is on multiple sedative medications.  The patient is drowsy, deconditioned, obese.  The patient would benefit cutting down on her scheduled pain medications.  We will leave to the primary care physician.  7.  History of pulmonary embolism.  The patient is on therapeutic INR of 2.6.   TIME SPENT:  45 minutes.     ____________________________ Susa GriffinsPadmaja Ison Wichmann, MD pv:ea D: 07/18/2012 03:24:09 ET T: 07/18/2012 04:34:30 ET JOB#: [id number]  cc: Susa GriffinsPadmaja Tayvon Culley, MD, <Dictator> Duane LopeJeffrey D. Judithann SheenSparks, MD  Susa GriffinsPADMAJA Abayomi Pattison  MD ELECTRONICALLY SIGNED 07/24/2012 0:20

## 2014-05-01 NOTE — H&P (Signed)
PATIENT NAME:  Ann Orozco, Ann Orozco MR#:  [id number] DATE OF BIRTH:  Jun 25, 1945  DATE OF ADMISSION:  10/02/2012  PRIMARY CARE PHYSICIAN: Dr. Aletha HalimJeff Sparks.   REASON FOR ADMISSION: Severe diarrhea and recurrent Clostridium difficile.     HISTORY OF PRESENT ILLNESS: This is a 69 year old female with multiple comorbidities including morbid obesity, hypertension, COPD on home O2, coronary artery disease, obstructive sleep apnea on CPAP, pulmonary embolism on chronic anticoagulation, who has had diarrhea off and on now for the last month. She initially was seen in her early August in clinic and had a folliculitis. She received clindamycin for this, however, she shortly developed diarrhea. She stopped the antibiotic but the diarrhea continued. She had a C. diff. test on August 14th which was positive. She started treatment then although she is unclear if she was taking vancomycin or Flagyl. Repeat C. diff. September 12th showed continued positive due to the persistent diarrhea. She was switched to vancomycin, she thinks 250 q.i.d. She has been on that and has 2 more tablets left. However, her diarrhea is quite persistent and she has been unable to keep up with it. Every time she eats or drinks she has a large liquid stool. She is having some abdominal pain as well. She has had no vomiting or nausea. She has had no fevers.   PAST MEDICAL HISTORY 1.  Hypertension.  2.  Hyperlipidemia.  3.  OSA, on CPAP.  4.  PE, on anticoagulation.  5.  Morbid obesity.  6.  Depression.  7.  Osteoarthritis.  8.  Chronic pain syndrome.  9.  Degenerative disk disease of the spine.  10.  Gout.  11.  Coronary artery disease.   PAST SURGICAL HISTORY 1.  Multiple abdominal surgeries.  2.  Hiatal hernia.   SOCIAL HISTORY: The patient is married. She is here with her husband today. She quit smoking in 2009. Does not drink alcohol. She is very dependent on her ADLs for her husband. She is able to walk a few steps.   FAMILY  HISTORY: Positive for hypertension, Alzheimer's disease and coronary artery disease.   ALLERGIES: THE PATIENT IS LISTED AS ALLERGIC TO REGLAN, TALWIN, TETRACYCLINE, CODEINE, MINOCIN, LEVAQUIN, CONTRAST DYE AND MORPHINE.   REVIEW OF SYSTEMS: Eleven systems reviewed and negative except as per HPI.  MEDICATIONS: Per prescription are to include  1. Aspirin 81 once a day.  2.  Butalbital compound 1 to 2 tabs every 4 hours as needed for headache.  3.  Fentanyl transdermal patch for 48 hours.  4.  Nabumetone 750 mg twice a day.  5.  Norco 325/10 every 4 hours.  5.  Imdur 1 tablet twice a day.  6.  Nitro-Dur 1 patch transdermal once a day.  7.  Ranexa 1000 mg twice a day.  8.  Verapamil 180, 1 tablet once a day.  9.  Coumadin 7.5 once a day.  10.  Lorazepam 1 mg 1 tablet every 4 hours as needed for anxiety.  11.  Citalopram 10 mg twice a day.  12.  Cymbalta 30 mg once a day.  13.  Trazodone 100 mg 2 tablets once a day.  14.  Lomotil p.r.n.  15.  Meclizine 25 mg t.i.d. as needed.  16.  Zofran 4 mg as needed.  17.  Allopurinol 300 mg once a day.  18.  Claritin 10 mg once a day.  19.  Lipitor 20 mg once a day.  20.  Sinemet 25/100 two tablets 3 times a day.  21.  Abilify 10 mg once a day.  22.  Ambien 10 mg once a day.  23.  Metoprolol 25 mg twice a day.  24.  Fosamax 70 mg once a week.  25.  Advair 500/50 twice a day.  26.  Nebulizers every 4 hours as needed.  27.  ProAir 2 puffs every 2 hours as needed.  28.  Spurious 18 mcg 1 each day.  29.  Lasix 40 mg once a day.  30.  Singulair 10 mg once a day.  31.  Nexium 40 mg once a day.  32.  Daliresp 500 mcg oral tablet once a day.  34.  Toviaz 8 mg once a day.   PHYSICAL EXAM  VITAL SIGNS:  Temperature 97.8, pulse 90, blood pressure 131/60, respirations 18, sat 94%.  GENERAL: She is morbidly obese, lying in bed in no acute distress.  HEENT: Pupils equal, round and reactive to light and accommodation. Mucous membranes are dry.  NECK:  Supple.  HEART: Regular but distant.  LUNGS: Clear to auscultation bilaterally.  ABDOMEN: Soft, obese, nontender, nondistended. No hepatosplenomegaly.  EXTREMITIES: There is trace bilateral lower extremity edema.  NEUROLOGIC: She is alert and oriented x 3. Grossly nonfocal neuro exam.   LABORATORY DATA: White blood count is 7.4, hemoglobin 12.1, platelets 191. Comprehensive panel is within normal limits except BUN slightly elevated at 19, creatinine is 1.08. ALT and AST are normal. Albumin is low at 2.9. Lipase is 96. Urinalysis is negative.   IMPRESSION: A 69 year old with morbid obesity, multiple medical problems, admitted with recurrent and progressive diarrhea due to Clostridium difficile.  She has been on treatment now for over a month with poor response.   PLAN 1.  Today we will admit her, give her IV fluids.  2.  Check a C. diff.  3.  Consult GI. She will likely benefit from a stool transplant and we can try to arrange this with Dr. Mechele Collin.  4.  We will avoid other anti-infectives at this point except for the vancomycin 500 q.i.d.  5.  For DVT prophylaxis, we will continue her on her Coumadin.  6.  Continue her on her outpatient medications as well.   ____________________________ Stann Mainland. Sampson Goon, MD dpf:cs D: 10/02/2012 18:26:28 ET T: 10/02/2012 18:59:37 ET JOB#: [id number]  cc: Stann Mainland. Sampson Goon, MD, <Dictator> Lillie Bollig Sampson Goon MD ELECTRONICALLY SIGNED 10/07/2012 21:34

## 2014-05-01 NOTE — Consult Note (Signed)
PATIENT NAME:  Ann Orozco, Ann Orozco MR#:  [id number] DATE OF BIRTH:  02/12/1945  DATE OF CONSULTATION:  10/03/2012  Dictated for Florentina JennyMartin H. Marva PandaSkulskie, MD  CONSULTING PHYSICIAN:  Keturah Barrehristiane H. Kemaya Dorner, NP  PRIMARY CARE PHYSICIAN: Dr. Judithann SheenSparks.   DATE OF CONSULTATION: 10/03/2012.   CONSULTING PHYSICIAN: Keturah Barrehristiane H Mc Bloodworth, NP.   REASON FOR CONSULTATION: GI has been consulted at the request of Dr. Sampson GoonFitzgerald for evaluation of Clostridium difficile diarrhea.    HISTORY OF PRESENT ILLNESS: I appreciate the consult for this 69 year old Caucasian woman with a complex health history admitted with diarrhea and C. diff. colitis, for evaluation of same. The patient was treated few weeks ago for C. diff with Flagyl 250 mg p.o. q.i.d. for  10 days. Seemed to get better, then had a relapse of diarrhea. Tested positive for C. diff. Vancomycin 250 mg p.o. q.i.d. was just started approximately nine days ago by GI clinic, [facility], for this patient. Called in clinic yesterday with recurrent diarrhea and feeling unwell. Ultimately was instructed to go to the ED. Was admitted.   She is currently receiving IVS and vancomycin 500 mg p.o. q.i.d. States she is feeling very much better than yesterday and has had no further diarrhea while here. She is afebrile and says she is really not having any significant abdominal pain or other GI problems. In further discussion with the patient about her progress with C. diff, it appears that although her husband cleaning surfaces with bleach and dishes are being washed appropriately t patient has been using hand wipes weights instead of soap and water to clean her hands after defecating.   PAST MEDICAL HISTORY: Hypertension, hyperlipidemia, sleep apnea on CPAP, pulmonary embolism, on warfarin therapy; morbid obesity, depression, osteoarthritis, chronic pain syndrome, degenerative disk disease of the spine gap, CAD, multiple abdominal surgeries, hiatal hernia.   SOCIAL HISTORY: The patient is  married. Lives with her husband. Quit smoking in 2009. No EtOH or illicits. Dependent on her ADLs with assistance of her husband.   FAMILY HISTORY: Significant for hypertension, Alzheimer's, CAD. No family history of colorectal cancer, colon polyps, liver disease or ulcers.   HOME MEDICATIONS: ASA 81 mg p.o. daily, butalbital compound 1 to 2 tablets p.o. q. 4 h., p.r.n. headaches, fentanyl transdermal patch q. 48 h., nabumetone 750 mg p.o. b.i.d., Imdur 1 tablet b.i.d., Nitro-Dur 1 patch transdermal once a day, Ranexa 1000 mg p.o. b.i.d. verapamil 180 mg p.o. q.d., Coumadin 7.5 mg p.o. once a day, Lorazepam 1 mg q.4 hours p.r.n. for anxiety, citalopram 10 mg twice a day, Cymbalta 30 mg once a day, trazodone 100 mg 2 tablets once a day, Lomotil p.r.n., meclizine 25 mg t.i.d. as needed,  Zofran q. 4-6 h. p.r.n. as needed, allopurinol 300 mg once a day, Claritin 10 mg once a day, Lipitor 20 mg once a day, Sinemet 25/100 two tablets p.o. 3 times a day, Abilify 10 mg once a day, Ambien 10 mg once a day, metoprolol 25 mg twice a day, Fosamax 70 mg once a week, Advair 500/50 inhaled twice daily, nebulizers p.r.n., ProAir 2 puffs q.2 h., p.r.n. Spiriva 18 mcg once a day, Lasix 40 mg p.o. daily, Singulair 10 mg p.o. daily, Nexium 40 mg p.o. daily, Daliresp 500 mcg p.o. daily, Toviaz 8 mg p.o. daily.   REVIEW OF SYSTEMS:  10 systems reviewed; unremarkable other than generalized weakness, dyspnea on exertion which is described as stable. She does wear oxygen chronically via nasal cannula.   Otherwise, systems review is currently  negative.   Most recent vital signs: Temp 98, pulse 94, blood pressure 131/83, SAO2  94%.   Most recent labs: Serum glucose 78, iron 62, BUN 19, creatinine 1.14, sodium 139, potassium 4.2, chloride 105, GFR 58. Iron saturation 46%, calcium 7.6, ferritin 209.   Lipase 96, total protein 6.0, albumin 2.9, total bilirubin 0.6, ALP 111, AST 23, ALT 7.   WBC 3.9, hemoglobin 8.9, hematocrit  26.5, platelet count 134.   Red cells are normocytic, with increased RDW. Of note, the patient was admitted yesterday with a hemoglobin of 12.1, hematocrit of 35.6, platelet count of 198, PT 22.3, INR 2.0.   Urinalysis was run and negative for signs of urinary tract infection.   GENERAL PHYSICAL EXAM: GENERAL: A morbidly obese woman sitting in a chair in no acute distress, appears comfortable.  HEENT: Normocephalic, atraumatic. No redness, drainage, or inflammation to the eyes or the nares. Mucous membranes are moist.  NECK: Supple. No JVD or lymphadenopathy.  RESPIRATIONS: Eupneic. Lungs with faint crackles in the left lower lobe. Otherwise clear, wearing nasal cannula oxygen. Able to speak well, in complete sentences. No increased work of breathing.  CARDIAC: S1, S2. RRR, no MRG. No trace ankle edema.  ABDOMEN: Large panniculus, obese abdomen. Bowel sounds x 4. Nontender, nondistended. No hepatosplenomegaly, although exam is somewhat limited due to patient's body habitus.  EXTREMITIES: MAEW x 4. Strength 5/5. Sensation intact.  SKIN: Warm, dry, pink. No obvious lesions or rash.  NEUROLOGIC: Alert, oriented x 3. Cranial nerves II-XII intact. Speech clear. No facial droop.  PSYCHIATRIC: Pleasant, calm, cooperative.   IMPRESSION AND PLAN: Clostridium difficile diarrhea, much better presently. Continue current. Do note anemia, but unsure if this a result. Difficult to believe the patient lost 4 grams of hemoglobin overnight without any signs of bleeding. Do agree that this should be repeated in the morning. It is already ordered.   Thank you very much for this consult. These services were provided by Vevelyn Pat, MSN, [facility], in collaboration with Barnetta Chapel, Orozco.D. with whom I have discussed this patient in full.     ____________________________ Keturah Barre, NP chl:dm D: 10/03/2012 13:26:24 ET T: 10/03/2012 13:55:27 ET JOB#: [id number]  cc: Keturah Barre, NP,  <Dictator> Eustaquio Maize Naydeline Morace FNP ELECTRONICALLY SIGNED 10/14/2012 10:04

## 2014-05-02 NOTE — Consult Note (Signed)
Brief Consult Note: Diagnosis: anorexia, persistent nausea & vomiting.   Patient was seen by consultant.   Comments: Ms. Ann Orozco is a 69 y/o morbidly obese caucasian female who was previously followed by Dr Marva PandaSkulskie admitted with UTI & hypotension.  She has had post-prandial nausea & vomiting for 4 weeks.  She is on dexilant 60mg  daily at home.  She has hx of recurrent end stage COPD, hx PE on chronic coumadin, c diff colitis, diverticulosis, GERD, & chronic anemia & hgb has been relatively stable over past year or so.  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  Persistent nausea & vomiting may be due to acute UTI, PUD, or gastroparesis.  Will check thyroid & cortisol level.  Unfortunately she is not a candidate for EGD at this time due to acute infection & her respiratory status.  Will discuss further with Dr Midge Miniumarren Wohl.  Plan: 1) Protonix 40mg  BID 2) Management UTI per attending 3) fasting cortisol/TSH 4) follow H/H 5) consider EGD with Dr Servando SnareWohl in future 6) management of coagulopathy given hx of PE per attending 7) Continue supportive measures such as PRN zofran & IVFs Thanks for allowing us to participate in the care of this patient.  Please see full dictated note. #[id number].  Electronic Signatures: Joselyn ArrowJones, Sydni Elizarraraz Orozco (NP)  (Signed 16-Feb-15 19:07)  Authored: Brief Consult Note   Last Updated: 16-Feb-15 19:07 by Joselyn ArrowJones, Mike Hamre Orozco (NP)

## 2014-05-02 NOTE — Consult Note (Signed)
Brief Consult Note: Diagnosis: Anemia/Leucopenia.   Comments: Patient has been transfused and has received Feraheme. Also has B12 deficiency which can contribute to cytopenia. Will commence on B12 daily until discharge and then weekly for 4 weeks and then monthyl.  Electronic Signatures: Antony Hasteamiah, Geanine Vandekamp S (MD)  (Signed 18-Feb-15 11:24)  Authored: Brief Consult Note   Last Updated: 18-Feb-15 11:24 by Antony Hasteamiah, Ahmiyah Coil S (MD)

## 2014-05-02 NOTE — Consult Note (Signed)
PATIENT NAME:  Ann ChurchesDUCKWORTH, Damica M MR#:  [id number] DATE OF BIRTH:  04-02-1945  DATE OF CONSULTATION:  05/30/2013  REFERRING PHYSICIAN:   CONSULTING PHYSICIAN:  Rodman Keyawn S. Harrison, NP  ADDENDUM  I have discussed the patient's presentation and current case findings with Dr. Dow AdolphMatthew Rein. Given her persistent symptoms of nausea and vomiting over the past month along with her surgical history, will proceed with an upper GI series as well as a gastric emptying study. Concern does exist for possible obstructive process at this time. Her surgical history is significant for gastric bypass as well as 2 revisions. She is a candidate for increased risk for adhesions. Do recommend that if these 2 imaging studies are unremarkable, the patient be scheduled for a CT scan of abdomen and pelvis with contrast. Will await results. The patient will be followed and monitored by Dr. Dow AdolphMatthew Rein over the weekend.   These services provided by Rodman Keyawn S. Harrison, MS, APRN, [facility], FNP under collaborative agreement with Lutricia FeilPaul Oh, MD.     ____________________________ Rodman Keyawn S. Harrison, NP dsh:sb D: 05/30/2013 16:49:09 ET T: 05/30/2013 17:19:52 ET JOB#: [id number]  cc: Rodman Keyawn S. Harrison, NP, <Dictator> Rodman KeyAWN S HARRISON MD ELECTRONICALLY SIGNED 05/30/2013 17:47

## 2014-05-02 NOTE — Consult Note (Signed)
Chief Complaint:  Subjective/Chief Complaint Pt has continued nausea & vomiting per her reports.  Denies abdominal pain, melena or rectal bleeding.  SOB improved.   VITAL SIGNS/ANCILLARY NOTES: **Vital Signs.:   18-Feb-15 01:00  Vital Signs Type 15 min Post Blood Start Time  Temperature Temperature (F) 97.7  Celsius 36.5  Temperature Source oral  Respirations Respirations 18  Systolic BP Systolic BP 104  Diastolic BP (mmHg) Diastolic BP (mmHg) 62  Mean BP 76  Pulse Ox % Pulse Ox % 96  Oxygen Delivery 3L; Nasal Cannula    05:00  Vital Signs Type Blood Transfusion Complete  Temperature Temperature (F) 98.1  Celsius 36.7  Temperature Source oral  Pulse Pulse 115  Respirations Respirations 18  Systolic BP Systolic BP 107  Diastolic BP (mmHg) Diastolic BP (mmHg) 63  Mean BP 77  Pulse Ox % Pulse Ox % 93  Oxygen Delivery 3L; Nasal Cannula   Brief Assessment:  GEN +morbidly obese, A/Ox3, NAD   Cardiac Regular  Irregular   Respiratory normal resp effort   Gastrointestinal details normal Soft  Nontender  exam limited due to body habitus   EXTR negative edema   Additional Physical Exam Skin: pink, warm, dry   Lab Results:  Routine Hem:  18-Feb-15 06:07   WBC (CBC)  2.4  RBC (CBC)  3.11  Hemoglobin (CBC)  10.6  Hematocrit (CBC)  32.4  Platelet Count (CBC)  141  MCV  104  MCH 34.0  MCHC 32.7  RDW  21.4  Neutrophil % 57.3  Lymphocyte % 33.2  Monocyte % 7.2  Eosinophil % 1.1  Basophil % 1.2  Neutrophil # 1.4  Lymphocyte #  0.8  Monocyte # 0.2  Eosinophil # 0.0  Basophil # 0.0 (Result(s) reported on 26 Feb 2013 at 07:06AM.)   Assessment/Plan:  Assessment/Plan:  Assessment Post-prandial nausea/vomiting: persistent despite UTI treatment & acid suppression.  TSH normal.  AM fasting cortisol just below normal. Pancytopenia: Received PRBCs transfusion with appropriate transfusion response.  No gross GI bleeding.  Appreciate hematology recommendations. Coagulopathy:  Recheck INR to see if EGD could safely be performed prior to discharge home   Plan 1) Continue Protonix 40mg  BID 2) follow H/H 3) INR 4) EGD if able, otherwise can be done outpatient with Dr Marva PandaSkulskie 5) Continue supportive measures, zofran Please call if you have any questions or concerns   Electronic Signatures for Addendum Section:  Joselyn ArrowJones, Megan Presti L (NP) (Signed Addendum 18-Feb-15 15:10)  INR 1.5. Orders placed for EGD tomorrow with Dr Servando SnareWohl.  Spoke with JaconaBobbie in Endo to arrange.   Electronic Signatures: Joselyn ArrowJones, Lucee Brissett L (NP)  (Signed 18-Feb-15 12:23)  Authored: Chief Complaint, VITAL SIGNS/ANCILLARY NOTES, Brief Assessment, Lab Results, Assessment/Plan   Last Updated: 18-Feb-15 15:10 by Joselyn ArrowJones, Jim Philemon L (NP)

## 2014-05-02 NOTE — H&P (Signed)
PATIENT NAME:  Ann ChurchesDUCKWORTH, Shreeya M MR#:  [id number] DATE OF BIRTH:  03-04-1945  DATE OF ADMISSION:  05/29/2013  REFERRING PHYSICIAN:  Dr. Lenard LancePaduchowski.  PRIMARY CARE PHYSICIAN:  [facility].   CHIEF COMPLAINT:  Weakness, chest pain.   HISTORY OF PRESENT ILLNESS:  A 69 year old Caucasian female with past medical history of COPD on supplemental O2 at baseline 3 liters at rest, 5 liters with activity, hypertension, hyperlipidemia, history of PE, presenting with weakness.  She was recently discharged from [facility] on 04/23/2013 with discharge diagnosis of UTI as well as chest pain, discharged from [facility], went home from the skilled nursing facility approximately one week ago.  Since that time she has been describing gradually worsening weakness which has been generalized.  She also states she was been having increased chest pain located in retrosternal, however radiation is from the right chest to left chest.  Pain is sharp to dull in quality, 7 to 8 out of 10 in intensity which occurs at rest.  She has no associated symptoms.  No worsening or relieving factors.  She has also been complaining of nausea and vomiting of nonbloody, nonbilious emesis, all since leaving the skilled nursing facility with inability to tolerate by mouth.  Also denotes having dysuria without associated fevers or chills.   REVIEW OF SYSTEMS:  CONSTITUTIONAL:  Positive for fatigue and weakness.  Denies fevers, chills.  EYES:  Denies blurred vision, double vision, eye pain.  HEENT:  Denies tinnitus, ear pain, hearing loss.  RESPIRATORY:  Denies cough.  Denies shortness of breath other than chronic.  CARDIOVASCULAR:  Positive for chest pain as described above.  Denies any orthopnea or edema.  GASTROINTESTINAL:  Positive for nausea and vomiting.  Denies diarrhea or abdominal pain.  GENITOURINARY:  Positive for dysuria.  Denies hematuria or change in frequency.  ENDOCRINE:   Denies nocturia or thyroid problems.  HEMATOLOGY AND LYMPHATIC:  Denies easy bruising, bleeding.  SKIN:  Denies rash or lesions.  MUSCULOSKELETAL:  Denies pain in neck, back, shoulder, knees, hips or arthritic symptoms.  NEUROLOGIC:  Denies paralysis, paresthesias.  PSYCHIATRIC:  Denies anxiety or depressive symptoms.  Otherwise, full review of systems performed by me is negative.   PAST MEDICAL HISTORY:  COPD on supplemental O2 at baseline, 3 liters at rest, 5 liters with activity.  Hypertension, hyperlipidemia, history of PE, obstructive sleep apnea requiring CPAP, coronary artery disease as well as Parkinson's.   SOCIAL HISTORY:  Remote tobacco usage.  Denies any alcohol or drug usage.  Recently discharged from [facility] as [facility] approximately one week ago.   FAMILY HISTORY:  Denies any known cardiovascular illnesses.   ALLERGIES:  CODEINE, CONTRAST DYE, DECONSAL, IODINE, LEVAQUIN, MINOCIN, MORPHINE, PENICILLIN, PREDNISONE, REGLAN AND TALWIN AS WELL AS TETRACYCLINE AND THEOPHYLLINE.   HOME MEDICATIONS:  Aspirin 81 mg by mouth daily, nabumetone 750 mg 2 tabs daily, tramadol 50 mg by mouth 4 times daily, nitroglycerin 0.4 mg sublingual as needed for chest pain, ranolazine 1000 mg by mouth twice daily, verapamil 240 mg by mouth daily, warfarin 7.5 mg by mouth daily, lorazepam 1 mg by mouth twice daily, escitalopram 10 mg by mouth daily, trazodone 100 mg 2 tablets by mouth at bedtime, venlafaxine 75 mg by mouth daily, loratadine 10 mg by mouth daily, atorvastatin 20 mg by mouth at bedtime, carbidopa, levodopa 25/100 mg B q. 6 hours, quetiapine 200 mg by mouth at bedtime, Ambien 10 mg by mouth at  bedtime, albuterol as needed for shortness of breath, Spiriva 18 mcg inhalation daily, ferrous sulfate 325 mg by mouth twice daily, MiraLAX daily, Singulair 10 mg by mouth daily, Lovaza 100 mg 2 capsules by mouth twice daily, pantoprazole 40 mg by mouth twice daily, fluticasone 44 mcg  inhalation 2 puffs twice daily, Toviaz 8 mg by mouth daily, vitamin B12 1000 mcg by mouth daily.   PHYSICAL EXAMINATION: VITAL SIGNS:  Temperature 98.1, heart rate 109, respirations 18, blood pressure 112/60, saturating 95% on 3 liters supplemental oxygen.  Weight 91.6 kg, BMI of 39.5.  GENERAL:  Chronically ill-appearing Caucasian female, currently in no acute distress.  HEAD:  Normocephalic, atraumatic.  EYES:  Pupils equal, round, reactive to light.  Extraocular muscles intact.  No scleral icterus.  MOUTH:  Moist mucous membranes.  Dentition intact.  No abscess noted.  EARS, NOSE, THROAT:  Throat clear without exudates.  No external lesions.  NECK:  Supple.  No thyromegaly.  No nodules.  No JVD.  PULMONARY:  Clear to auscultation bilaterally without wheezes, rubs, or rhonchi.  No use of accessory muscles.  Good respiratory effort.  CHEST:  Minimal tenderness to palpation over right upper sternal border.  CARDIOVASCULAR:  S1, S2, regular rate and rhythm.  No murmurs, rubs or gallops.  No edema.  Pedal pulses 2+ bilaterally.  GASTROINTESTINAL:  Soft, nontender, nondistended.  No masses.  Positive bowel sounds.  No hepatosplenomegaly.  MUSCULOSKELETAL:  No swelling or clubbing.  Positive edema lower extremities 1 to 2+ shins bilaterally.  Range of motion is full in all extremities.  NEUROLOGIC:  Cranial nerves II through XII intact.  No gross focal neurological deficits.  Sensation intact.  Reflexes intact.  SKIN:  No ulcerations, lesions, rash, cyanosis.  Skin warm, dry.  Turgor intact.  PSYCHIATRIC:  Mood and affect within normal limits.  The patient is awake, alert, oriented x 3.  Insight and judgment intact.   LABORATORY DATA:  EKG performed revealing sinus tachycardia in the low 100s without significant ST or T wave abnormalities.  Chest x-ray performed, no significant change compared to prior films other than low lung volumes.  CT head performed, no acute intracranial process.  Remainder of  laboratory data:  Sodium 139, potassium 4, chloride 108, bicarb 26, BUN 9, creatinine 0.88, glucose 90, calcium 7.6, total protein 4.6, albumin 2.2, otherwise LFTs within normal limits.  Troponin I less than 0.02.  Urine drug screen positive for opiates, TCAs as well as MDMA.  WBC 8.3, hemoglobin 10.1, platelets 266.  Urinalysis, WBCs 14, RBCs 173, leukocyte esterase 1+, epithelial cells 1.   ASSESSMENT AND PLAN:  A 69 year old female presenting with weakness.  1.  Urinary tract infection, history of Klebsiella pneumonia as well as Enterococcus faecalis based off of prior sensitivities.  We will place on Macrobid as well as ceftriaxone for Klebsiella.  We will follow culture data and adjust accordingly.  2.  Weakness, likely multifactorial.  Her urinary tract infection may be playing a component, though essentially she is unable to care for herself at home and her husband is unable to provide adequate care as well.  We will get therapy involved as she will likely need a longer term placement, whether this will require more case management or social work and see how she can actually stay at the facility for a longer period of time.  3.  Chest pain.  These are vague symptoms.  She has known angina, on Ranexa.  She had a recent normal stress  test done less than a month ago.  We will trend cardiac enzymes and have her on telemetry.  4.  Chronic obstructive pulmonary disease.  She is at her baseline as far as respiratory status goes.  Continue with supplemental O2 as well as breathing treatments as her home schedule.  5.  Obstructive sleep apnea.  She can use her own CPAP machine.  6.  Hypertension.  Continue with verapamil.  7.  Venous thromboembolism prophylaxis on heparin subQ.  8.  CODE STATUS:  THE PATIENT IS FULL CODE.   TIME SPENT:  45 minutes.    ____________________________ Cletis Athens. Hower, MD dkh:ea D: 05/29/2013 23:02:47 ET T: 05/30/2013 00:21:03 ET JOB#: [id number]  cc: Cletis Athens. Hower, MD,  <Dictator> DAVID Synetta Shadow MD ELECTRONICALLY SIGNED 05/30/2013 3:38

## 2014-05-02 NOTE — Consult Note (Signed)
PATIENT NAME:  Ann Orozco, Ann M MR#:  [id number] DATE OF BIRTH:  1945/11/01  DATE OF CONSULTATION:  02/24/2013  REFERRING PHYSICIAN:  Duane LopeJeffrey D. Judithann SheenSparks, MD CONSULTING PHYSICIAN:  Midge Miniumarren Wohl, MD, and Joselyn ArrowKandice L. Friedrich Harriott, NP  PRIMARY CARE PHYSICIAN:  Duane LopeJeffrey D. Judithann SheenSparks, MD  PRIMARY GASTROENTERITIS:  Christena DeemMartin U. Skulskie, MD  REASON FOR CONSULTATION: Anorexia and vomiting.   HISTORY OF PRESENT ILLNESS: Ann Orozco is a 69 year old morbidly obese Caucasian female who was admitted to the hospital with weakness, difficulty ambulating, recent falls and was found to have urinary tract infection and SIRS. She had noted she has had anorexia and vomiting for the past 4 weeks. She tells me she vomits every time she tries to eat anything. Even  the smell of food can initiate vomiting. She has been eating small meals about every 3 hours and trying Pepto and Emetrol, which seemed to help a little bit. She has not had a bowel movement in 4 days, denies any diarrhea. She does have a history of C. diff previously. She did have an EGD years ago but cannot remember the findings. She has been taking Dexilant 60 mg daily at home for heartburn and indigestion. Her weight has been stable. She denies any dysphagia or odynophagia. Her hemoglobin is chronically in the 8 to 9 range for the last several years. In September of 2014, she had recurrent C. diff and was treated with Flagyl and vancomycin. She has an INR of 3.9 and her Coumadin was stopped. She has history of pulmonary emboli. She is on aspirin 81 mg daily. She was admitted with a white blood cell count 3.9 which has since dropped to 1.9. She actually had pancytopenia today. Her hemoglobin was 8.7 yesterday, down to 8.2 today, and platelet count is 135. Last colonoscopy was by Dr. Mechele CollinElliott August 2012, where she had hemorrhoids and sigmoid diverticulosis. He recommended repeat colonoscopy in 5 years. She had a 12 mm polyp of the ascending colon in 2005. She was noted to be  hypotensive in the ER. She was also complaining of dizziness. She denies having any workup of her persistent nausea.   Abdominal surgeries include a gastric bypass in 1973 with a redo in 1974 and 1983 (?), ruptured hernia, appendectomy and cholecystectomy.   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 diff, gastric bypass x 3 in 1973, 1974 and 1983; ruptured hernia, appendectomy, cholecystectomy and sleep apnea.  History of depression and anxiety.    Abdominal surgeries including gastric bypass with reconstruction and a gastric bypass in 1973 with a redo and 74 and 83? Ruptured hernia, appendectomy, and cholecystectomy.   MEDICATIONS PRIOR TO ADMISSION: Acetaminophen/hydrocodone 325/10 mg q.4 hours, Advair Diskus 500 mcg/50 mcg b.i.d., alendronate 70 mg weekly, allopurinol 300 mg daily, aluminum hydroxide/magnesium/simethicone 200/200/20 mg/5 mL 15 mL q.i.d., aspirin 81 mg daily, atorvastatin 20 mg at bedtime carbidopa/levodopa 50/200 mg extended-release q.i.d., Daliresp 500 mcg daily, escitalopram 10 mg b.i.d., ferrous sulfate 325 mg b.i.d. fish oil 1 gram daily, fluticasone nasal 50 mcg 2 sprays in each nostril daily, furosemide 40 mg daily p.r.n., isosorbide 120 mg extended-release b.i.d., loratadine 10 mg daily, lorazepam 1 mg b.i.d., metoprolol 25 mg b.i.d., montelukast 10 mg at bedtime, nabumetone 750 mg b.i.d., Nexium 40 mg daily, nitroglycerin 0.4 mg sublingual p.r.n., Zofran 4 mg q.i.d., MiraLax 17 grams in 8 ounces b.i.d. p.r.n.  constipation, ProAir HFA 2 puffs b.i.d. p.r.n.,  Quetiapine 200 mg at bedtime, ranolazine 1 gram extended-release b.i.d., Spiriva 18 mcg inhaler daily, Toviaz 8 mg daily, trazodone 100 mg 2 tablets at bedtime, Coumadin 6 mg daily, zolpidem 10 mg at  bedtime.   ALLERGIES: INCLUDE CODEINE, IV CONTRAST, DECONSAL, MINOCIN, IODINE, LEVAQUIN, MORPHINE, PENICILLIN, PREDNISONE, REGLAN, TALWIN, TETRACYCLINE, THEOPHYLLINE (SEE CHART FOR TYPE OF ALLERGIC REACTION).   FAMILY HISTORY: There is no known family history of carcinoma, liver or chronic GI problems. Father deceased secondary to melanoma, mother secondary to old age.   SOCIAL HISTORY: She is a former smoker. She has a 40-pack tobacco use. Denies any illicit drug use or alcohol use. She resides at home with her husband. She also has a mentally and emotionally challenged son who is age 21. She previously worked in [employer], fashion/clothing.   REVIEW OF SYSTEMS: See history of present illness.  MUSCULOSKELETAL: She does complain of aches and pains. She complains of joint pain. She has parkinsonian tremors.  CONSTITUTIONAL: She has chronic fatigue.  RESPIRATORY: She has dyspnea on exertion. She has and deconditioning and shortness of breath on exertion.  CARDIOVASCULAR: She has had palpitations and tachycardia as well as hypotension.  PSYCHIATRIC: She has history of depression and anxiety.  GENITOURINARY: She has had increased urinary frequency.  Otherwise negative 12-point review of systems.   PHYSICAL EXAMINATION: VITAL SIGNS: Temperature 98, pulse 101, respirations 18, blood pressure 119/75, O2 sat 96% on 3 liters via nasal cannula. GENERAL: Ann Orozco is a morbidly obese 68 year old Caucasian female who is alert, oriented, pleasant and cooperative, in no acute distress.  HEENT: Sclerae clear, anicteric. Conjunctivae pink. Oropharynx pink and moist without lesions.  NECK: Supple without any mass or thyromegaly.  CHEST: Heart regular rate and rhythm with normal S1, S2 without any murmurs, clicks, rubs or gallops.  LUNGS: Chronic emphysematous changes bilaterally with decreased breath sounds. No significant adventitious sounds. No acute distress.  ABDOMEN: Rotund, positive bowel sounds x 4, soft, nontender,  nondistended without palpable hepatosplenomegaly or mass although exam is limited given patient's body habitus.  EXTREMITIES: Without clubbing or edema.  SKIN: Pink, warm and dry without any rash or jaundice.  NEUROLOGIC: Grossly intact.  PSYCHIATRIC: Alert, cooperative, normal mood and affect.   LABORATORY, DIAGNOSTIC AND RADIOLOGICAL DATA:  Calcium 7.2, chloride 110, anion gap 5, otherwise normal basic metabolic panel. Albumin 1.9, total protein 4.4, otherwise normal LFTs. A chest x-ray showed atelectasis, scar right middle lobe. Negative troponin.   IMPRESSION: Ms. Haydon is a 69 year old morbidly obese Caucasian female who was previously followed by Dr. Marva Panda, admitted with urinary tract infection and hypotension (systemic inflammatory response syndrome). She has had postprandial nausea and vomiting for 4 weeks. She is on Dexilant 60 mg daily at home. She has history of recurrent end-stage chronic obstructive pulmonary disease, oxygen-dependent, history of pulmonary embolism on chronic Coumadin, Clostridium difficile colitis, diverticulosis, gastroesophageal reflux disease and chronic anemia and hemoglobin has been relatively stable over the past year or so. She has pancytopenia and is coagulopathic. Persistent nausea and vomiting may be due to acute urinary tract infection, peptic ulcer disease or gastroparesis. Will check thyroid and cortisol levels as well. Unfortunately, she is not a candidate for esophagogastroduodenoscopy at this time due to acute infection and her respiratory status. I have discussed her care further with Dr. Midge Minium.   PLAN: 1.  Protonix 40 mg b.i.d.  2.  Management of urinary tract infection as per attending.  3.  Fasting cortisol and  TSH.  4.  Follow H and H.  5.  Consider EGD with Dr. Servando Snare in the future.  6.  Management of coagulopathy given history of PE per attending.  7.  Continue supportive measures including is Zofran p.r.n. for nausea and  vomiting.  I would like to thank you for allowing [place] to participate in the care of Ms. Reap.  These services provided by Lorenza Burton, NP, under collaborative agreement with Dr. Midge Minium.   ____________________________ Joselyn Arrow, NP klj:cs D: 02/24/2013 19:06:00 ET T: 02/24/2013 19:33:23 ET JOB#: [id number]  cc: Joselyn Arrow, NP, <Dictator> Joselyn Arrow FNP ELECTRONICALLY SIGNED 03/06/2013 10:29

## 2014-05-02 NOTE — H&P (Signed)
PATIENT NAME:  Ann Orozco, Ann Orozco MR#:  [id number] DATE OF BIRTH:  18-Jun-1945  DATE OF ADMISSION:  06/11/2013  REASON FOR ADMISSION:  Altered mental status.   PRIMARY CARE PHYSICIAN:  Dr. Aram Beecham.   REFERRING PHYSICIAN:  Dr. Sharma Covert.   HISTORY OF PRESENT ILLNESS:  A 69 year old female with history of multiple hospitalizations due to do her COPD and multiple urinary tract infections.  The patient has been seen in the Emergency Department on 05/29/2013.  She was discharged home on 06/03/2013 with a diagnosis of urinary tract infection.  The patient comes again today after being her normal self this morning and by normal self meaning that the patient has a significant decline and for what she is on hospice recently.  She is not able to eat many of her meals.  She does not walk anymore.  She always nauseous or with some degree of distress.  The patient this evening was really confused, did not have a fever, did not have any chills, just looked very ill as per the hospital.  At this moment the patient is confused.  The patient's husband is a very poor historian as well.  She has been recently started on hospice within a week ago and she had a Foley catheter placed a week ago.  When the patient comes to the Emergency Department she looks severely ill with altered mental status.  Blood pressure of 70s/50s, rate of 83 and a temperature 97.5.  Her urine is dark Coca-Cola color with multiple white blood cells.  The patient diagnosed with a urinary tract infection and treated for that.  The patient is admitted today for treatment of this condition.  I had a long conversation with the husband and the patient.  The husband agrees that there is nothing much they can do for her, but she went to be admitted and treated for this UTI.  They are still not quite sure of how hospice works for what we are going to do a palliative care consult in the morning.  THE PATIENT IS STILL A FULL CODE AND SHE WANTS TO REMAIN TO BE A  FULL CODE.  THE HUSBAND IS AWARE THAT THE PATIENT IS ON THE SITUATION WHERE SHE IS GOING TO DIE WITHIN THE NEXT SEVERAL MONTHS, BUT HE SUPPORTS HER DECISION OF BEING FULL CODE.   REVIEW OF SYSTEMS:  A total history and review of system review is done.  Unable to obtain due to altered mental status.  PAST MEDICAL HISTORY:  1.  Severe COPD.  2.  Chronic respiratory failure, 2 liters nasal cannula.  3.  Hypertension.  4.  Hyperlipidemia.  5.  History of PE.  6.  Obstructive sleep apnea.  7.  Coronary artery disease.  8.  Parkinson.   ALLERGIES:  CODEINE, CONTRAST, DECONSAL, IODINE, LEVAQUIN, MINOCIN, MORPHINE, PENICILLIN, PREDNISONE, REGLAN, TALWIN, TETRACYCLINE AND THEOPHYLLINE.   PAST SURGICAL HISTORY:  Unable to obtain as the patient has altered mental status.   SOCIAL HISTORY:  The patient used to smoke.  Currently she quit last year.  No alcohol.  Recently discharged from [facility] back to home on hospice.   FAMILY HISTORY:  From previous records, no cardiovascular illness.  Unable to fully obtain due to the patient's altered mental status.   HOME MEDICATIONS:  Include aspirin 81 mg daily, nabumetone 750 mg twice daily, tramadol 50 mg 4 times a day, nitroglycerin as needed for chest pain, Ranexa 1000 mg twice daily, verapamil 240 mg daily, warfarin 7.5 mg  daily, lorazepam 1 mg twice daily, citalopram 10 mg daily, trazodone 100 mg at bedtime, venlafaxine 75 mg daily, loratadine 10 mg daily, atorvastatin 20 mg daily, carbidopa/levodopa 25/100 mg q. 6 hours, Seroquel 200 mg by mouth at bedtime, Ambien 10 mg as needed for insomnia, , ferrous sulfate 325 mg twice daily, MiraLAX once daily, singular 10 mg daily, Lovaza 100 mg twice daily, Protonix 40 mg daily, fluticasone 44 mcg inhaled twice daily, Toviaz 8 mg by mouth, B12 1000 mcg daily.   PHYSICAL EXAMINATION:  VITAL SIGNS:  Blood pressure 79/50, pulse 83, respirations 16, temperature 97.5, oxygen saturation 94% on 3 liters.  GENERAL:  The  patient is confused, continues to say that she is very nauseous.  Not very cooperative in the sense of giving information.  Hemodynamically stable at this moment.  HEENT:  Her pupils are equal and reactive.  Extraocular movements are intact.  Mucosae are dry.  Anicteric sclerae.  Pink conjunctivae.  No oral lesions.  No oropharyngeal exudates.  NECK:  Supple.  No JVD.  No thyromegaly.  No adenopathy.  No carotid bruits.  CARDIOVASCULAR:  As well, no tenderness to palpation anterior chest wall.  LUNGS:  Clear without any wheezing or crepitations.  No use of accessory muscles at this moment.  Positive chronic rhonchi.  ABDOMEN:  Soft, nontender, nondistended.  Bowel sounds are decreased.  The patient has some mild tenderness whenever palpation of the colonic frame deeply.  The patient feels really severely constipated.  GENITAL:  Negative for external lesions.  Foley in place.  Coca-Cola colored urine.  EXTREMITIES:  Positive edema, +2.  NEUROLOGIC:  Significant weakness of the lower extremities, 2 to 3 out of out of 5, upper extremities 3 to 4 out of 5.  DTRs decreased in the lower extremities.  All the symptoms seems to be bilateral with any significant focal findings.  Sensation is decreased in the lower extremities.  Neurologic in general, the patient is actually confused.  She is alert and oriented only to person.  Earlier she was really lethargic, but now she is starting to wake up a little bit more.  PSYCHIATRIC:  No agitation.  LYMPHATIC:  Negative for lymphadenopathy in the neck or supraclavicular areas.  SKIN:  No rashes or petechiae.  MUSCULOSKELETAL:  No significant joint effusions or joint swelling.   LABORATORY DATA:  Creatinine 0.5, glucose 69, potassium 3.7, calcium 7.6, albumin 1.7.  LFTs otherwise within normal limits.  Troponin is negative.  White count 2.5, hemoglobin 8.2, platelet count 150.  INR is 1.1.  White count on urine is 657, red count is 171.  PH is 7.36 with a pO2 of 65%  and an HCO3 of 20.  Her CO2 on serum is 24.  CT of the head without contrast, no acute intracranial abnormality.  Atrophy is consistent with aging.  Lungs appeared to be clear with some chronic scarring and atelectasis.   ASSESSMENT AND PLAN:  A 69 year old female with history of multiple hospitalizations, recently discharged home on hospice, comes back today to get treatment for urinary tract infection.  1.  Altered mental status.  The patient has significant changes on her mental status due to urinary tract infection.  At this moment, the patient is going to be treated.  Looking at previous sensitivities over a year ago she had an ESBL Escherichia coli which was significantly resistant to many antibiotics, but that pathogen has not repeated itself on the following visits to [place].  Her last urinalysis showed  Klebsiella pneumonia and enterococcus.  Due to her medication allergies, we cannot choose one single antibiotic that will cover this to bacteria for what we are going to put her on Rocephin and Macrodantin.  The patient was recently discharged on Macrodantin home for treatment of this pathogen.  Blood cultures taken.  Palliative care services requested.  Follow up on urine cultures.  2.  As far as her chronic obstructive pulmonary disease, the patient has no signs of exacerbation.  No signs of pneumonia.  Chest x-ray is clear.  The patient continued to use 3 liters nasal cannula.    3.  Dysphagia.  The patient is really not able to eat many things.  At this moment, we are going to put her on pureed diet, crush pills.  No need for swallow evaluation at this moment unless any significant concerns arise.  The patient does not have history of aspiration.  We are going to monitor closely and continue with hospice care.  4.  PALLIATIVE CARE SERVICES ARE REQUESTED AS THE PATIENT IS STILL A FULL CODE AND SHE DESIRES TO BE A FULL CODE AND SHE KEEPS BEING HOSPITALIZED FOR THE SAME CONDITIONS DESPITE THE FACT  THAT SHE IS ON HOSPICE.  5.  As far as her other medical problems, continue with Ranexa, aspirin. for CAD  6.  Her high blood pressure at this moment was low.  Hold verapamil.   7.  As far as her history of pulmonary embolus, continue warfarin as the patient wants to continue to be a FULL CODE.  The patient is subtherapeutic at this moment.  No need for bridging.  8.  Coronary artery disease.  No signs of acute coronary syndrome at any point.  9.  Deep vein thrombosis prophylaxis.  Continue warfarin.  10.  Gastrointestinal prophylaxis with Protonix.   I spent about 60 minutes with this patient.  The patient belongs to Dr. Judithann SheenSparks.  We are going to transfer care in the morning.      ____________________________ Felipa Furnaceoberto Sanchez Gutierrez, MD rsg:ea D: 06/11/2013 01:11:27 ET T: 06/11/2013 01:37:39 ET JOB#: [id number]  cc: Felipa Furnaceoberto Sanchez Gutierrez, MD, <Dictator> Khianna Blazina Juanda ChanceSANCHEZ GUTIERRE MD ELECTRONICALLY SIGNED 06/15/2013 19:32

## 2014-05-02 NOTE — Discharge Summary (Signed)
Dates of Admission and Diagnosis:  Date of Admission 11-Jun-2013   Date of Discharge 14-Jun-2013   Admitting Diagnosis UTI, COPD   Final Diagnosis 1. Pseudomonas UTI 2. COPD 3. Chronic resp failure 4. Deconditioning    Chief Complaint/History of Present Illness HISTORY OF PRESENT ILLNESS:  A 69 year old female with history of multiple hospitalizations due to do her COPD and multiple urinary tract infections.  The patient has been seen in the Emergency Department on 05/29/2013.  She was discharged home on 06/03/2013 with a diagnosis of urinary tract infection.  The patient comes again today after being her normal self this morning and by normal self meaning that the patient has a significant decline and for what she is on hospice recently.  She is not able to eat many of her meals.  She does not walk anymore.  She always nauseous or with some degree of distress.  The patient this evening was really confused, did not have a fever, did not have any chills, just looked very ill as per the hospital.  At this moment the patient is confused.  The patient's husband is a very poor historian as well.  She has been recently started on hospice within a week ago and she had a Foley catheter placed a week ago.  When the patient comes to the Emergency Department she looks severely ill with altered mental status.  Blood pressure of 70s/50s, rate of 83 and a temperature 97.5.  Her urine is dark [employer] color with multiple white blood cells.  The patient diagnosed with a urinary tract infection and treated for that.  The patient is admitted today for treatment of this condition.  I had a long conversation with the husband and the patient.  The husband agrees that there is nothing much they can do for her, but she went to be admitted and treated for this UTI.  They are still not quite sure of how hospice works for what we are going to do a palliative care consult in the morning.  THE PATIENT IS STILL A FULL CODE AND  SHE WANTS TO REMAIN TO BE A FULL CODE.  THE HUSBAND IS AWARE THAT THE PATIENT IS ON THE SITUATION WHERE SHE IS GOING TO DIE WITHIN THE NEXT SEVERAL MONTHS, BUT HE SUPPORTS HER DECISION OF BEING FULL CODE.   Allergies:  Theophylline: Alt Ment Status  Prednisone: Alt Ment Status  Levaquin: Hives  Morphine: Itching  Reglan: Hives  Talwin NX: GI Distress  Codeine: Hives  Tetracycline: Hives  Iodine Strong: Unknown  Minocin: Unknown  Penicillin: Rash, Itching  Contrast - Iodinated Radiocontrast Dye: Other  Deconsal II: Unknown  Routine Chem:  06-Jun-15 06:09   Glucose, Serum 77  BUN 7  Creatinine (comp)  0.41  Sodium, Serum 143  Potassium, Serum  3.3  Chloride, Serum  113  CO2, Serum 24  Calcium (Total), Serum  7.8  Anion Gap  6  Osmolality (calc) 282  eGFR (African American) >60  eGFR (Non-African American) >60 (eGFR values <4m/min/1.73 m2 may be an indication of chronic kidney disease (CKD). Calculated eGFR is useful in patients with stable renal function. The eGFR calculation will not be reliable in acutely ill patients when serum creatinine is changing rapidly. It is not useful in  patients on dialysis. The eGFR calculation may not be applicable to patients at the low and high extremes of body sizes, pregnant women, and vegetarians.)  Routine Hem:  06-Jun-15 06:09   WBC (CBC)  3.4  RBC (CBC)  2.31  Hemoglobin (CBC)  8.0  Hematocrit (CBC)  24.9  Platelet Count (CBC)  128  MCV  108  MCH  34.8  MCHC 32.4  RDW  15.8  Neutrophil % 71.3  Lymphocyte % 18.5  Monocyte % 8.5  Eosinophil % 1.3  Basophil % 0.4  Neutrophil # 2.5  Lymphocyte #  0.6  Monocyte # 0.3  Eosinophil # 0.0  Basophil # 0.0 (Result(s) reported on 14 Jun 2013 at 06:33AM.)   Pertinent Past History:  Pertinent Past History PAST MEDICAL HISTORY:  1.  Severe COPD.  2.  Chronic respiratory failure, 2 liters nasal cannula.  3.  Hypertension.  4.  Hyperlipidemia.  5.  History of PE.  6.   Obstructive sleep apnea.  7.  Coronary artery disease.  8.  Parkinson.   Hospital Course:  Hospital Course 1.  Altered mental status. likey due to uti, now mental status back to normal. pt eval and treated for weakness  2.  copd with chronic respiratory failure on 3l o2 at baseline, continue spriva and flovent 3.  Dysphagia. advance diet. Resolved. 4.  cad continue asa. , imdure 5.  axiety d/o conitnue prn ativan 6.  uti due to psedomonas, change to cipro d/c ceftriaxone  ON day of discharge pt felt back to baseline and has requested to be discharged home.  Time spent on discharge activity 40 min   Condition on Discharge Guarded   Code Status:  Code Status Full Code   PHYSICAL EXAM ON DISCHARGE:  Physical Exam:  HEENT pale conjunctivae   NECK No masses  thyroid not tender   RESP no use of accessory muscles  wheezing   CARD No LE edema   EXTR negative edema   DISCHARGE INSTRUCTIONS HOME MEDS:  Medication Reconciliation: Patient's Home Medications at Discharge:     Medication Instructions  lorazepam 1 mg oral tablet  1 tab(s) orally 2 times a day   acetaminophen-hydrocodone 325 mg-10 mg oral tablet  1 tab(s) orally every 6 hours, As needed, pain   nitroglycerin 0.4 mg sublingual tablet  1 tab(s) sublingual , As needed, chest pain   ranolazine 1000 mg oral tablet, extended release  1 tab(s) orally 2 times a day   warfarin 1 mg oral tablet  7.5 tab(s) orally once a day   escitalopram 10 mg oral tablet  1 tab(s) orally once a day   trazodone 100 mg oral tablet  2 tab(s) orally once a day (at bedtime)   venlafaxine 75 mg oral capsule, extended release  1 cap(s) orally once a day   ondansetron 4 mg oral tablet  1 tab(s) orally every 4 hours, As needed, nausea, vomiting   atorvastatin 20 mg oral tablet  1 tab(s) orally once a day (at bedtime)   carbidopa-levodopa 25 mg-100 mg oral tablet  1 tab(s) orally every 6 hours   aspirin 81 mg oral delayed release tablet  1 tab(s)  orally once a day   quetiapine 200 mg oral tablet  1 tab(s) orally once a day (at bedtime)   tiotropium 18 mcg inhalation capsule  1 cap(s) inhaled once a day   verapamil 240 mg/12 hours oral tablet, extended release  1 tab(s) orally once a day   polyethylene glycol 3350 oral powder for reconstitution  17 gram(s) orally once a day, As needed, constipation   montelukast 10 mg oral tablet  1 tab(s) orally once a day (at bedtime)   pantoprazole 40 mg oral  delayed release tablet  1 tab(s) orally 2 times a day   fesoterodine 4 mg oral tablet, extended release  1 tab(s) orally once a day   cipro 500 mg oral tablet  1 tab(s) orally 2 times a day   nabumetone 750 mg oral tablet  2 tab(s) orally 2 times a day   fluticasone cfc free 44 mcg/inh inhalation aerosol  2  inhaled 2 times a day     Physician's Instructions:  Home Oxygen? Yes   Diet Low Sodium   Activity Limitations As tolerated   Return to Work Not Applicable   Time frame for Follow Up Appointment 1-2 weeks  PCP   Electronic Signatures: Onna Nodal, Lottie Dawson (MD)  (Signed 08-Jun-15 21:03)  Authored: ADMISSION DATE AND DIAGNOSIS, CHIEF COMPLAINT/HPI, Allergies, PERTINENT LABS, PERTINENT PAST HISTORY, HOSPITAL COURSE, PHYSICAL EXAM ON DISCHARGE, DISCHARGE INSTRUCTIONS HOME MEDS, PATIENT INSTRUCTIONS   Last Updated: 08-Jun-15 21:03 by Alba Destine (MD)

## 2014-05-02 NOTE — Discharge Summary (Signed)
PATIENT NAME:  Ann Orozco, Ann Orozco MR#:  [id number] DATE OF BIRTH:  1945/11/12  DATE OF ADMISSION:  02/22/2013 DATE OF DISCHARGE: 02/27/2013  TYPE OF DISCHARGE: The patient is transferred to skilled nursing facility.   REASON FOR ADMISSION: Generalized weakness with difficulty ambulating.   HISTORY OF PRESENT ILLNESS: There is a 69 year old female with a history of morbid obesity, COPD, osteoarthritis, coronary artery disease, depression, Parkinson's tremor, and anxiety who presented to the Emergency Room with frequent falls, weakness and failure to thrive. In the Emergency Room, the patient was noted to have a UTI. She was hypertensive and tachycardic. She was admitted for further evaluation.   PAST MEDICAL HISTORY: 1. Morbid obesity.  2. Chronic respiratory failure, on oxygen.  3. COPD. 4. Benign hypertension.  5. Gout.  6. Atherosclerotic cardiovascular disease.  7. GE reflux disease.  8. Hyperlipidemia.  9. History of pulmonary embolism.  10. Parkinsonism tremor.  11. Urinary incontinence.  12. Anxiety/depression.   MEDICATIONS ON ADMISSION: Please see admission note.   ALLERGIES: CODEINE, CONTRAST DYE, PREDNISONE, THEOPHYLLINE, LEVAQUIN, MORPHINE, PENICILLIN, REGLAN, TETRACYCLINE AND MINOCIN.   SOCIAL HISTORY: The patient is a former smoker. No history of alcohol abuse.   FAMILY HISTORY: Positive for melanoma, but otherwise unremarkable.   REVIEW OF SYSTEMS: As per admission note.   PHYSICAL EXAM: GENERAL: The patient was in no acute distress.  VITAL SIGNS: Stable except for a blood pressure of 87/52.  HEENT: Unremarkable.  NECK: Supple without JVD.  LUNGS: Clear.  CARDIAC: Revealed a regular rate and rhythm with normal S1, S2.  ABDOMEN: Soft and nontender.  EXTREMITIES: Without edema.  NEUROLOGIC: Exam was grossly nonfocal.   HOSPITAL COURSE: The patient was admitted with SIRS with hypotension, tachycardia and a UTI. Blood cultures were negative. Urine culture was  positive for Klebsiella. She was placed on IV antibiotics. She complained of nausea and vomiting. She was seen by GI. Endoscopy revealed poor gastric emptying with gastritis. Medical management was recommended. She did poorly with ambulation. Placement was recommended. She was seen by oncology for her pancytopenia that was felt to be multifactorial. No acute intervention was needed. She was started on B12 shots. Placement was recommended and the patient agreed. She is now transferred to the skilled nursing facility for further care and rehabilitation.   DISCHARGE DIAGNOSES: 1. Systemic inflammatory response syndrome due to urinary tract infection.  2. Generalized weakness and fatigue with failure to thrive.  3. Pancytopenia.  4. B12 deficiency.  5. Benign hypertension.  6. Chronic obstructive pulmonary disease.  7. Chronic respiratory failure.  8. Morbid obesity.  9. Gout.  10. Parkinsonism tremor.  11. History of pulmonary embolism.  12. Anxiety/depression.   DISCHARGE MEDICATIONS: 1. Aspirin 81 mg p.o. daily.  2. Allopurinol 300 mg p.o. daily.  3. Lipitor 20 mg p.o. at bedtime.  4. Sinemet CR 50/200 1 p.o. t.i.d.  5. Lexapro 10 mg p.o. daily.  6. Iron sulfate 325 mg p.o. daily.  7. B12 1000 mcg IM every week.  8. Toviaz 8 mg p.o. daily.  9. Advair 500/50 1 puff b.i.d.  10. Nitrostat 0.4 mg sublingually p.r.n. chest pain.  11. Septra DS 1 p.o. b.i.d. x 1 week.  12. Ambien 5 mg p.o. at bedtime.  13. Claritin 10 mg p.o. daily.  14. Ativan 1 mg p.o. b.i.d.  15. Singulair 10 mg p.o. daily.  16. Relafen 500 mg p.o. b.i.d.  17. Protonix 40 mg p.o. b.i.d.  18. Seroquel 200 mg p.o. at bedtime.  19.  Ranexa 1000 mg p.o. b.i.d.  20. Spiriva 1 capsule inhaled daily.  21. Trazodone 200 mg p.o. at bedtime.  22. Norco 10/325,  1 p.o. q.4 hours p.r.n. pain.  23. Ceftin 250 mg p.o. b.i.d. x 1 week.  24. DuoNeb SVNs q.i.d.  25. Oxygen at 3 liters per minute per nasal cannula.   FOLLOW-UP  PLANS AND APPOINTMENTS: The patient is a FULL CODE. She will be followed by the resident physician at the skilled nursing facility. She is on a soft diet with Ensure supplements t.i.d. She will be seen in consultation by physical therapy and speech therapy.   ____________________________ Duane LopeJeffrey D. Judithann SheenSparks, MD jds:sg D: 02/27/2013 09:39:05 ET T: 02/27/2013 09:51:22 ET JOB#: [id number]  cc: Duane LopeJeffrey D. Judithann SheenSparks, MD, <Dictator> Donnesha Karg Rodena Medin Rufus Beske MD ELECTRONICALLY SIGNED 02/27/2013 13:43

## 2014-05-02 NOTE — Consult Note (Signed)
PATIENT NAME:  Ann Orozco, PRISCO MR#:  [id number] DATE OF BIRTH:  1945/11/04  DATE OF CONSULTATION:  05/30/2013  ATTENDING:  Dr. Aram Beecham.  CONSULTING PHYSICIAN:  Rodman Key, NP/Matthew Alycia Rossetti, MD  REASON FOR CONSULTATION:  Nausea and vomiting.   HISTORY OF PRESENT ILLNESS:  Ms. Ann Orozco is a 69 year old Caucasian female who has a significant past medical history of COPD on supplemental O2 at 3 L baseline, 5 L with activity, hypertension, hyperlipidemia, a history of PE. She presented to the Emergency Room yesterday for the chief complaints of weakness and chest pain. She was recently discharged from [facility] on 04/23/2013 with the diagnosis of a UTI and chest pain. She was discharged to [facility]. According to the patient, she has been battling urinary tract infections since January of this year.   The patient states that she was experiencing increased chest pain, more retrosternal. It was radiating to the right side of her chest, to the left as well at times. Sharp, dull pain, was initially 7 to an 8 on a scale of 1 to 10. The patient has been experiencing persistent nausea and vomiting for the past month. States that everything that she "eats or drinks within 15 minutes" states that it usually comes up. She has been trying to maintain adequate caloric intake with Ensure and juice. The patient was weighing 264 pounds at one time. She is unable to elaborate exactly the date. On admission she was 188. Her surgical history is significant for bypass surgery initially in 1975 with a revision in 1980 as well as 1983. The patient is essentially bedridden at this time.   The patient has a history of constipation, states she was taking a stool softener as well as MiraLAX one point, states than that she believes that she was recently diagnosed with C. difficile colitis. States that she was treated with antibiotic therapy at that time. It looks like the C. difficile was actually in 09/20/2012. The  patient states since that time though that her bowel pattern has been altered. A known history of GERD, which is well controlled. Dysphagia at times with difficulty swallowing solid foods as well as liquids, seems to occur to the cricopharyngeal area. The patient's primary gastroenterologist has been Dr. Barnetta Chapel. It looks like in review of EMR though she had a colonoscopy done in 2012 which revealed multiple small and large mouth diverticula in the sigmoid colon, descending colon internal hemorrhoids are noted. Otherwise unremarkable. Dr. Midge Minium did an EGD that was done 02/27/2013, which revealed a medium-size hiatal hernia. Food was found in the lower third of the esophagus, diffuse, moderate inflammation characterized by congestion and erythema was found in the gastric antrum. Biopsies were obtained. Biopsies revealed antral mucosa with mild chronic gastritis and superficial vascular congestion and reactive foveolar hyperplasia, negative for Helicobacter. Negative for dysplasia or malignancy.   PAST MEDICAL HISTORY:  A history of COPD O2 dependent, hypertension, hyperlipidemia, PE, obstructive sleep apnea on CPAP, coronary artery disease and Parkinson's disease. Chronic gastritis.   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   FAMILY HISTORY:  Father history of melanoma. No history of colon cancer or colonic polyps.   SOCIAL HISTORY:  Remote tobacco use. No alcohol or drug use. Recently discharged from [facility] to [facility].   ALLERGIES:  CODEINE, IV CONTRAST, DECONSAL, IODINE, LEVAQUIN, MINOCIN, MORPHINE, PENICILLIN, PREDNISONE, REGLAN, TALWIN AS WELL AS TETRACYCLINE  AND THEOPHYLLINE.  OUTPATIENT MEDICATIONS:  Albuterol inhalation aspirate as directed, aspirin 81 mg daily, atorvastatin 20 mg once a day, carbidopa with levodopa 25/100 mg 1 tablet every 6  hours, Escitalopram 100 mg once daily, ferrous sulfate 325 mg 1 tablet twice a day, fesoterodine 4 mg 1 tablet once a day, fluconazole 2 puffs 2 times daily, loratadine 10 mg once a day, lorazepam 1 mg twice a day, Lovaza ethyl ester 1000 mg 2 capsules twice a day, Montelukast 100 mg once a day, nabumetone 750 mg 2 tablets daily, Macrodantin 100 mg twice a day, nitroglycerin 0.4 mg sublingual 1 sublingual as needed for chest pain, pantoprazole, Protonix that is, 40 mg one twice a day, MiraLAX as directed as needed, quetiapine 100 mg once a day, ranolazine 1000 mg twice a day, Spiriva 18 mcg inhalation 1 inhalation once a day, Tiotropium 18 mcg inhalation 1 once a day, Toviaz 8 mg once daily, tramadol 50 mg once every 4 hours, trazodone 100 mg 3 tablets once a day, Effexor 75 mg 1 capsule once a day, verapamil 240 mg/24 hours oral capsule extended release 1 capsule once a day, vitamin B12 1000 mg once a day, warfarin 7.5 mg once a day and Zolpidem 10 mg once daily.   REVIEW OF SYSTEMS:  CONSTITUTIONAL:  Significant for weakness. No fevers. No chills.  EYES:  No blurred vision, double vision.  HEENT:  No tinnitus. No ear pain, hearing loss.  RESPIRATORY:  No cough but significant for shortness of breath, chronic in nature related to her history of COPD, O2 dependent.  CARDIOVASCULAR:  No chest pain. See HPI.  GASTROINTESTINAL:  See HPI. GENITOURINARY:  Significant for dysuria, persistent urinary tract infection.  ENDOCRINE:  No thyroid problems.  HEMATOLOGIC AND LYMPHATIC:  Denies easy bruising or bleeding.  SKIN:  No rashes. No lesions.  MUSCULOSKELETAL:  No neuralgias, no myalgias.  NEUROLOGIC:  No history of CVA or TIA.  PSYCHIATRIC: No depression, anxiety. Otherwise a full review of systems performed by myself is negative.   PHYSICAL EXAMINATION:  VITAL SIGNS:  Temperature is 97.8 with a pulse of 92, respirations are 18, blood pressure 96/66 with a pulse ox of 99%.  GENERAL:  Well-developed,  overweight, 69 year old Caucasian female, no acute distress noted. Pleasant.  HEENT:  Normocephalic, atraumatic. Pupils equal and reactive to light. Conjunctivae clear. Sclerae anicteric.  NECK:  Supple. Trachea midline. No lymphadenopathy or thyromegaly.  PULMONARY:  Symmetric rise and fall of chest. Clear to auscultation throughout. O2 in place.  CARDIOVASCULAR:  Regular rhythm, S1, S2. No murmurs, no gallops.  ABDOMEN:  Large, but excessive skin present. Soft. Bowel sounds in 4 quadrants, tenderness to left upper quadrant. No evidence of hepatosplenomegaly. No bruits. No masses.  RECTAL:  Deferred.  MUSCULOSKELETAL:  No contractures. No clubbing.  EXTREMITIES:  No edema.  PSYCHIATRIC:  Alert and oriented x 4. Memory grossly intact. Appropriate affect and mood.  NEUROLOGICAL:  No gross neurological deficits.   LABORATORY AND DIAGNOSTIC DATA:  Chemistry panel:  Chloride is elevated at 108, anion gap is low at 5. Calcium is low at 7.6. Hepatic panel:  Total protein is 4.6 with albumin of 2.2, ALT is less than 6, otherwise within normal limits. Cardiac enzymes first in series within normal limits and troponin has remained less than 0.02. Urine drug screen is positive for opiates as well as _____ and MDMA. CBC:  Hemoglobin is 10.1 with hematocrit of 31.4. WBC is normal. 8.3. PT is 13.8 with an INR of  1.1. Urinalysis reveals +2 blood, +1 leukocytes, RBCs 173 per high-power field with WBC of 14 per high-power field. CT of head without contrast was performed for the indication of a fall:  Mild atrophy with mild periventricular small vessel disease. Chest, portable single view:  No significant changes in appearance of the chest allowing for slightly greater hypoinflation today except for slightly greater hypoinflation.   IMPRESSION:  Persistent nausea and vomiting, been present for the past month. A known history of a gastric bypass surgery requiring revision twice, chronic obstructive pulmonary disease, O2  , persistent a urinary tract infection, obstructive sleep apnea, a known history of coronary artery disease.   PLAN:  The patient's presentation will be discussed by Dr. Trixie DeisMatthew Ryan and a care plan to follow in an addendum form.   These services provided by Rodman Keyawn S. Harrison, MS, APRN, [facility], FNP, in collaborative agreement with Dr. Trixie DeisMatthew Ryan.   ____________________________ Rodman Keyawn S. Harrison, NP dsh:jm D: 05/30/2013 15:54:39 ET T: 05/30/2013 16:57:34 ET JOB#: [id number]  cc: Rodman Keyawn S. Harrison, NP, <Dictator> Rodman KeyAWN S HARRISON MD ELECTRONICALLY SIGNED 06/04/2013 17:29

## 2014-05-02 NOTE — Discharge Summary (Signed)
PATIENT NAME:  Ann Orozco, Ann Orozco MR#:  [id number] DATE OF BIRTH:  08-22-1945  DATE OF ADMISSION:  04/20/2013 DATE OF DISCHARGE:  04/23/2013  TYPE OF DISCHARGE: The patient is transferred to skilled nursing facility.   REASON FOR ADMISSION: Chest pain with urinary tract infection.   HISTORY OF PRESENT ILLNESS: The patient is a 69 year old female with a history of small vessel coronary artery disease being medically managed. Also has a history of morbid obesity, recurrent UTIs, and anxiety/depression with COPD and chronic oxygen dependence. She presented to the Emergency Room with chest pain. Initial enzymes negative. Also was noted to have a UTI and was admitted for further evaluation.   PAST MEDICAL HISTORY:  1.  Morbid obesity.  2.  Chronic obstructive pulmonary disease.  3.  Chronic respiratory failure, on oxygen. 4.  Small vessel coronary artery disease being medically managed.  5.  Benign hypertension.  6.  Hyperlipidemia.  7.  History of pulmonary embolism.  8.  Parkinson's disease.  9.  Anxiety/depression. 10. Chronic insomnia.   MEDICATIONS ON ADMISSION: Please see admission note.   ALLERGIES: CODEINE, CONTRAST DYE, IODINE, LEVAQUIN, MINOCIN, MORPHINE, PENICILLIN, REGLAN, SULFA, TETRACYCLINE, THEOPHYLLINE AND TALWIN.   SOCIAL HISTORY: The patient has a remote history of tobacco abuse. No history of alcohol abuse.   FAMILY HISTORY: Positive for melanoma, but otherwise unremarkable.  REVIEW OF SYSTEMS: As per history of present illness.   PHYSICAL EXAMINATION:  GENERAL: The patient was in no acute distress.  VITAL SIGNS: Remarkable for a blood pressure of 124/76, heart rate 125, respiratory rate 22, temperature 97.8, oxygen sat was 100% on 3 L.  HEENT: Normocephalic, atraumatic. Pupils are equal and reactive to light and accommodation. Extraocular movements are intact. Sclerae were anicteric. Conjunctivae are clear. Oropharynx dry, but clear.  NECK: Supple without JVD. No  adenopathy or thyromegaly is noted.  LUNGS: Revealed wheezes with scattered rhonchi. No rales. No dullness. Respiratory effort was normal.  CARDIAC: Rapid rate. Normal S1 and S2. No significant murmurs.  ABDOMEN: Soft, nontender, with normoactive bowel sounds. No organomegaly or masses were appreciated. No hernias or bruits were noted.  EXTREMITIES: Without clubbing, cyanosis or edema. Pulses were 2+ bilaterally.  SKIN: Warm and dry without rash or lesions.  NEUROLOGIC: Cranial nerves II through XII grossly intact. Deep tendon reflexes were symmetric. Motor and sensory exam is nonfocal.  PSYCHIATRIC: Revealed a patient who was alert and oriented to person, place, and time. She was cooperative and used good judgment.   LABORATORY DATA: EKG revealed sinus tachycardia with no acute ischemic changes. Initial troponin was less than 0.02.   HOSPITAL COURSE: The patient was admitted with chest pain with presumed angina. She was also admitted with a UTI and SIRS. She ruled out for sepsis with negative blood cultures. Her tachycardia improved. She was started on IV antibiotics and IV fluids. She was seen by cardiology. Lexiscan Myoview was performed and was negative for ischemia. Urine culture came back positive for Enterococcus faecalis. She was placed on p.o. Macrobid and continued to do well. She did develop worsening anemia with a hemoglobin below 8. Given her angina, transfusion was recommended by cardiology. She was transfused 2 units of packed red blood cells with improvement of her hemoglobin. There was no evidence of active bleeding. She was seen by physical therapy, who recommended placement for skilled nursing and physical therapy. She is now transferred to the skilled nursing facility for further care and rehabilitation.   DISCHARGE DIAGNOSES:  1.  Unstable angina.  2.  Atherosclerotic cardiovascular disease.  3.  Chronic obstructive pulmonary disease.  4.  Chronic respiratory failure, on oxygen.   5.  Anemia of chronic disease.  6.  Systemic inflammatory response syndrome, resolved.  7.  Urinary tract infection.  8.  Anxiety/depression. 9.  Chronic insomnia.  10.  Parkinson's disease.   DISCHARGE MEDICATIONS:  1.  Oxygen at 3 L per minute per nasal cannula.  2.  Aspirin 81 mg p.o. daily.  3.  Lipitor 20 mg p.o. at bedtime.  4.  Sinemet 25/100, 1 p.o. q.6 hours.  5.  Lexapro 10 mg p.o. daily.  6.  Detrol LA 4 mg p.o. daily.  7.  Claritin 10 mg p.o. daily.  8.  Ativan 1 mg p.o. b.i.d.  9.  Singulair 10 mg p.o. at bedtime.  10. Nitrostat 0.4 mg sublingually p.r.n. chest pain.  11. Dulara 5/100, 1 puff b.i.d.  12. Compazine 10 mg p.o. q.6 hours p.r.n. nausea and vomiting.  13. Protonix 40 mg p.o. b.i.d.  14. Seroquel 200 mg p.o. at bedtime.  15. Ranexa 1000 mg p.o. b.i.d.  16. Spiriva 1 capsule inhaled daily.  17. Trazodone 200 mg p.o. at bedtime.  18. Toprol-XL 25 mg p.o. daily.  19. Macrobid 100 mg p.o. b.i.d. x 10 days.  20. DuoNeb SVNs q.i.d.  21. Iron sulfate 325 mg p.o. b.i.d.   FOLLOWUP PLANS AND APPOINTMENTS: The patient will be followed by the resident physician at the skilled nursing facility. She is on a low-fat, low-cholesterol diet. She will be seen in consultation by physical therapy. Will obtain a CBC and a MET-B in 1 week and a urine culture in 2 weeks.  ____________________________ Leonie Douglas Doy Hutching, MD jds:aw D: 04/23/2013 07:43:49 ET T: 04/23/2013 07:58:28 ET JOB#: [id number]  cc: Leonie Douglas. Doy Hutching, MD, <Dictator> Mikala Podoll Lennice Sites MD ELECTRONICALLY SIGNED 04/23/2013 9:27

## 2014-05-02 NOTE — Consult Note (Signed)
Chief Complaint:  Subjective/Chief Complaint Pt notes 7/10 lower abdominal pain & chest pain & requesting more pain medications.  Denies nausea or vomiting.  "I am ready to eat".  BMs normal.   Brief Assessment:  GEN well developed, no acute distress, obese, A/Ox3   Cardiac Regular   Respiratory normal resp effort   Gastrointestinal details normal Soft  Nondistended  Bowel sounds normal  No rebound tenderness  No gaurding   EXTR negative cyanosis/clubbing, negative edema   Additional Physical Exam Skin: pink, warm, dry   Lab Results: Routine Chem:  25-May-15 03:46   Result Comment INR - RESULTS VERIFIED BY REPEAT TESTING.  - NOTIFIED OF CRITICAL VALUE  - C/IVIE FRYAR AT [id number] 06/02/13.PM[facility]  - READ-BACK PROCESS PERFORMED.  Result(s) reported on 02 Jun 2013 at 05:17AM.  Routine Coag:  25-May-15 03:46   Prothrombin  38.8  INR  4.2 (INR reference interval applies to patients on anticoagulant therapy. A single INR therapeutic range for coumarins is not optimal for all indications; however, the suggested range for most indications is 2.0 - 3.0. Exceptions to the INR Reference Range may include: Prosthetic heart valves, acute myocardial infarction, prevention of myocardial infarction, and combinations of aspirin and anticoagulant. The need for a higher or lower target INR must be assessed individually. Reference: The Pharmacology and Management of the Vitamin K  antagonists: the seventh ACCP Conference on Antithrombotic and Thrombolytic Therapy. Chest.2004 Sept:126 (3suppl): [id number]. A HCT value >55% may artifactually increase the PT.  In one study,  the increase was an average of 25%. Reference:  "Effect on Routine and Special Coagulation Testing Values of Citrate Anticoagulant Adjustment in Patients with High HCT Values." American Journal of Clinical Pathology 2006;126:400-405.)   Radiology Results: XRay:    24-May-15 15:46, Upper GI With Barium Swallow  Upper GI With  Barium Swallow   REASON FOR EXAM:    persistent n/v,  hx gastric bypass,  uanble to   tolerate clear liquids  COMMENTS:       PROCEDURE: FL  - FL UPPER GI W/ BARIUM SWALLOW  - Jun 01 2013  3:46PM     CLINICAL DATA:  Previous gastric surgery. Progressive nausea,  weakness. Unable to stand.    EXAM:  UPPER GI SERIES WITHOUT KUB    TECHNIQUE:  Routine upper GI series was performed with thin and high density  barium.  FLUOROSCOPY TIME:  2 min 48 seconds    COMPARISON:  01/09/2012    FINDINGS:  Scout: Surgical clips in the left upper and right mid abdomen.  Anastomotic staple lines in the epigastric region. Unremarkable  bowel gas pattern.    Esophagus: Normal in caliber, contour, and distensibility. No focal  mucosal lesion identified. No stricture identified. There is  abnormal esophageal peristalsis with incomplete clearance of the  swallowed material via tertiary contractions.    Stomach: GE junction unremarkable. The fundus is small. The cardia  and body of the stomach did not distend completely. There isa  staple line along the lesser curvature. There are somewhat thickened  and disorganized rugal folds through this region but no definite  mucosal lesion. The antrum distended normally. Contrast passed  easily through the pylorus.    Duodenum: The duodenum bulb distends normally. Duodenal sweep is  unremarkable. Ligament of Treitz in the left upper abdomen. Proximal  small bowel decompressed.     IMPRESSION:  1. Markedly abnormal esophageal peristalsis with incomplete  clearance via tertiary contractions.  2. Incomplete distention of the gastric  cardia and body, likely  related at least in part to previous surgery. No definite fixed  stenosis or mucosal lesion.  3. No stricture or other obstructive lesion identified.      Electronically Signed    By: Oley Balm M.D.    On: 06/01/2013 16:24         Verified By: Philis Fendt, M.D.,   Assessment/Plan:   Assessment/Plan:  Assessment Nausea/Vomiting: Resolved at present.  Reviewed UGI series results with patient & Dr Servando Snare.  She has markedly abnormal esophageal peristalsis.  Unfortunately, treatment for this is usually supportive with small bites of food followed by liquid bolus.   Plan 1) Continue clear liquids with swallowing precautions.  Advance to small bites of food followed by liquid bolus. 2) continue PPI 3) continue supportive measures [facility] to resume care in AM Please call if you have any questions or concerns   Electronic Signatures: Joselyn Arrow (NP)  (Signed 25-May-15 13:26)  Authored: Chief Complaint, Brief Assessment, Lab Results, Radiology Results, Assessment/Plan   Last Updated: 25-May-15 13:26 by Joselyn Arrow (NP)

## 2014-05-02 NOTE — H&P (Signed)
PATIENT NAME:  Ann Orozco, Ann Orozco MR#:  [id number] DATE OF BIRTH:  [birth year]  DATE OF ADMISSION:  04/19/2013  REFERRING PHYSICIAN: Dr. Sharyn Creamer   PRIMARY CARE PHYSICIAN: Dr. Aram Beecham at [facility].   CHIEF COMPLAINT: Chest pain, generalized weakness.  HISTORY OF PRESENT ILLNESS: A 69 year old Caucasian female with a history of coronary artery disease, COPD, hypertension, hyperlipidemia who presented with chest pain. She was recently seen at [facility] on 04/15/2013 diagnosed with UTI at that time and discharged on Bactrim therapy. She has currently, presenting with chest pain for a 2-day duration. She described it as retrosternal in location, "pain" when asked for further qualifying information. She states it is nonradiating and 10/10 in intensity. No worsening factors. No relieving factors. It hurts at rest. She is also complaining on weakness which has been present essentially since January and has been persistent.   REVIEW OF SYSTEMS:  CONSTITUTIONAL: Positive for subjective chills, fatigue, weakness. Denies fevers.  EYES: Denied any blurry vision, double vision, or eye pain.  HEENT: Denies tinnitus, ear pain, hearing loss.  RESPIRATORY: Denies cough, wheeze, shortness of breath. CARDIOVASCULAR: Positive for chest pain as described above. Denies palpitations or edema.  GASTROINTESTINAL: Positive for nausea, denies any vomiting, diarrhea, abdominal pain.  GENITOURINARY: Denies dysuria or hematuria.  ENDOCRINE: Denies nocturia or thyroid problems.  HEMATOLOGIC AND LYMPHATIC: Denies easy bruising, bleeding. SKIN: Denies rash or lesions. MUSCULOSKELETAL: Denies pain in neck, back, shoulders, knees or hips, no arthritic symptoms.  NEUROLOGIC: Denies paralysis or paresthesias.  PSYCHIATRIC: Denies anxiety or depressive symptoms.   Otherwise, full review of systems performed by me is negative.   PAST MEDICAL HISTORY: COPD, hypertension, hyperlipidemia, history of PE, as  well as coronary artery disease and Parkinson's.   SOCIAL HISTORY: Remote tobacco usage. Denies any alcohol or drug usage.   FAMILY HISTORY: Positive for positive for melanoma; however, no history of cardiovascular diseases.   ALLERGIES: CODEINE, CONTRAST DYE, IODINE LEVAQUIN, MINOCIN, MORPHINE, PENICILLIN, REGLAN, TETRACYCLINE, THEOPHYLLINE, TALWIN.   HOME MEDICATIONS: Include acetaminophen hydrocodone 325/5 mg p.o. q. 6 hours as needed for pain, aspirin 81 mg p.o. daily, nitroglycerin 0.4 mg sublingual every 5 minutes as needed for chest pain, Ranexa 1000 mg p.o. b.i.d., and lorazepam 1 mg by mouth twice daily, Lexapro 10 mg p.o. daily, trazodone 100 mg 2 tablets p.o. at bedtime, Compazine 5 mg 1 to 2 tablets every 6-8 hours as needed for nausea, Zyloprim 100 mg 2 tablets daily, loratadine 10 mg p.o. at bedtime, atorvastatin 20 mg p.o. at bedtime, Sinemet 25/100 mg 1 tablet 4 times daily, quetiapine 200 mg p.o. at bedtime, Ambien 5 mg p.o. at bedtime, Dulera 5/100 mcg inhalation 1 puff b.i.d., Spiriva 18 mcg inhalation daily, Feosol 325 mg 1 tablet p.o. daily, montelukast 10 mg p.o. daily, Bactrim double strength 800/160 1 tablet b.i.d. for a total of 7 days. This was started on April 7. Pantoprazole 40 mg p.o. b.i.d., Toviaz 8 mg p.o. daily, vitamin B12 one tablet p.o. daily, Imdur which she unsure of the dosing.   PHYSICAL EXAMINATION:  VITAL SIGNS: Temperature 97.8, heart rate 125, respirations 22, blood pressure 124/76, saturating 100% on 3 liters nasal cannula which is her baseline. Weight 91.6 kg BMI 43.8.  GENERAL: Chronically ill-appearing Caucasian female, currently in no acute distress.  HEAD: Normocephalic, atraumatic.  EYES: Pupils equal, round of reactive to light. Extraocular muscles intact. No scleral icterus.  MOUTH: Dry mucosal membrane. Dentition poor. No abscess noted.  EAR, NOSE, THROAT: They are clear  without exudates. No external lesions.  NECK: Supple. No thyromegaly. No  nodules. No JVD.  PULMONARY: Scant wheezing, most prominent in left upper lobe with scattered rhonchi throughout lung fields. No use of accessory muscles. Good respiratory effort.  CHEST: Nontender to palpation.  CARDIOVASCULAR: S1, S2, tachycardic. No murmurs, rubs, or gallops. No edema. Pedal pulses 2+ bilaterally.  GASTROINTESTINAL: Soft, nontender, nondistended. No masses. Positive bowel sounds. No hepatosplenomegaly.  MUSCULOSKELETAL: No swelling, clubbing, edema. Range of motion full in all extremities.  NEUROLOGIC: Cranial nerves II-XII intact. No gross focal neurological deficits. Sensation intact. Reflexes intact.  SKIN: No ulcerations, lesions, rash, cyanosis. Skin warm, dry. Turgor intact.  PSYCHIATRIC: Mood and affect blunted and flat affect: She is, however, awake, alert, oriented x 3. Insight and judgment intact.   LABORATORY DATA: EKG performed showed sinus tachycardia 125 with no ST or T-wave abnormality.   The remainder of laboratory data: Sodium 135, potassium 4, chloride 105, bicarbonate 27, BUN 11, creatinine 0.65, glucose 72. Troponin I less than 0.02. WBC 4.4, hemoglobin 9.7, platelets of 223,000. Urinalysis: WBC 181, RBC 7, leukocyte esterase 2+.   Chest x-ray performed revealing no acute cardiopulmonary process.   ASSESSMENT AND PLAN: A 69 year old Caucasian female with a history of coronary artery disease, chronic obstructive pulmonary disease, presenting with chest pain. 1. Sepsis: septic criteria by heart rate and respiratory rate; likely urinary source. Will treat with outpatient treatment of a urinary tract infection. She was on Bactrim therapy. still has uti findings, placed on ceftriaxone. She has a history of pansensitive Klebsiella back in February. Provide IV fluid hydration to keep mean arterial pressure greater than 65.  2. Chest pain. Give aspirin and statin therapy. Place on cardiac telemetry. Trend cardiac enzymes x 3. Consult cardiology as she follows with  Dr. Gwen PoundsKowalski.  3. Chronic obstructive pulmonary disease. Continue Spiriva, Advair, Dulera, and supplemental O2 to keep oxygen saturation greater than 92%. 4. Gastroesophageal reflux disease, proton pump inhibitor therapy.  5. Venous thromboembolism prophylaxis with heparin subcutaneous.   CODE STATUS: The patient is full code.   TIME SPENT: 45 minutes.     ____________________________ Cletis Athensavid K. Hower, MD dkh:lt D: 04/20/2013 02:03:58 ET T: 04/20/2013 03:35:43 ET JOB#: [id number]  cc: Cletis Athensavid K. Hower, MD, <Dictator> DAVID Synetta ShadowK HOWER MD ELECTRONICALLY SIGNED 04/20/2013 20:15

## 2014-05-02 NOTE — Consult Note (Signed)
PATIENT NAME:  Ann Orozco, Ann Orozco MR#:  [id number] DATE OF BIRTH:  February 15, 1945  DATE OF CONSULTATION:  04/20/2013  REFERRING PHYSICIAN:   CONSULTING PHYSICIAN:  Herschell Dimesichard J. Wieting, MD   REASON FOR CONSULTATION: Acute chest pain with known coronary artery disease, hypertension, hyperlipidemia, COPD, and urinary tract infection.   CHIEF COMPLAINT: Chest pain.  HISTORY OF PRESENT ILLNESS:  A 69 year old female with known coronary artery disease, hypertension, hyperlipidemia, COPD who has had small vessel coronary artery disease and stable angina on appropriate medication management. Currently, the patient has had a urinary tract infection causing some other stressors and currently has had some more shortness of breath and chest discomfort, substernal in nature radiating into her back, associated with shortness of breath. The patient has had this waxing and waning and did have improvements with hospitalization. The patient has had no further chest pain since being in the hospital.  The patient has had an EKG showing sinus tachycardia with poor R-wave progression and non-specific ST and T wave changes. The patient has had a normal troponin, CK-MB without evidence of myocardial infarction,  and currently has no evidence of congestive heart failure type symptoms. The remainder of review of systems negative for vision change, ringing in the ears, hearing loss, cough, congestion, heartburn, nausea, vomiting, diarrhea, bloody stools, stomach pain, extremity pain, leg weakness, cramping of the buttocks, known blood clots, headaches, blackouts, dizzy spells, nosebleeds, congestion, trouble swallowing, muscle weakness, numbness, anxiety, depression, skin lesions, or skin rash.   PAST MEDICAL HISTORY:  1. Hypertension.  2. COPD.  3. Hyperlipidemia.  4. Coronary artery disease.   FAMILY HISTORY: No family members with early onset of cardiovascular disease or hypertension.   SOCIAL HISTORY: She currently denies  alcohol or tobacco use.   ALLERGIES:  AS LISTED.   MEDICATIONS: As listed.   PHYSICAL EXAMINATION:  VITAL SIGNS: Blood pressure 122/68 bilaterally, heart rate 70 upright, reclining, and slightly irregular.  GENERAL: She is a well-appearing female in no acute distress.  HEENT: No icterus, thyromegaly, ulcers, hemorrhage, or xanthelasma.  CARDIOVASCULAR: Regular rate and rhythm. Normal S1 and S2 with diffuse heart sounds. No apparent murmur, gallop, rub. PMI is diffuse. Carotid upstroke normal without bruit. Jugular venous pressure is normal.  LUNGS:  Have a few expiratory wheezes but no evidence of rhonchi.  ABDOMEN: Soft, nontender, without hepatosplenomegaly or mass. Abdominal aorta is normal size without bruit.  EXTREMITIES: Shows 2+ bilateral pulses in dorsal pedal, radial, trace femoral and. There is a trace lower extremity edema. No cyanosis, clubbing, or ulcers.  NEUROLOGIC: She is oriented to time, place, and person with normal mood and affect.   ASSESSMENT:  A 69 year old female with hypertension, hyperlipidemia, chronic obstructive pulmonary disease with urinary tract infection having exacerbation of chest discomfort beyond stable to unstable angina responding to appropriate medication management including nitroglycerin without evidence of myocardial infarction or new EKG changes.   RECOMMENDATIONS:  1. Continue serial ECG and enzymes to assess for myocardial infarction.  2. Begin ambulation and follow for further significant symptoms of angina and adjustments of medications including further nitrates. 3. Continue to treat urinary tract infection and/or antibiotics may be exacerbating her symptoms of angina.  4. If ambulating and still having some chest discomfort would consider Lexiscan infusion, Myoview for further evaluation and treatment of possible coronary artery disease.  5. Continue hypertension and hyperlipidemia treatment with current outpatient medication management, which  is appropriate.  6. Further diagnostic testing and treatment options after above.  ____________________________ Lamar Blinks, MD bjk:dd D: 04/20/2013 14:58:46 ET T: 04/20/2013 19:08:48 ET JOB#: [id number]  cc: Lamar Blinks, MD, <Dictator> Lamar Blinks MD ELECTRONICALLY SIGNED 04/22/2013 7:50

## 2014-05-02 NOTE — H&P (Signed)
PATIENT NAME:  Ann Orozco, Ann Orozco MR#:  [id number] DATE OF BIRTH:  [birth year]  DATE OF ADMISSION:  02/22/2013  PRIMARY CARE PHYSICIAN: Dr. Aram Beecham  CHIEF COMPLAINT: Generalized weakness and difficulty ambulating.   HISTORY OF PRESENT ILLNESS: This is a 69 year old female who presents to the hospital from her home due to generalized weakness and difficulty ambulating, status post a recent fall. The patient apparently had a fall about a few days back. She normally ambulates with the help of a rolling walker. She says her legs gave way and she fell to the floor. Since then, she has been having significant weakness, where she is requiring increasing help from her son and her husband to get up from the bed, go to the bathroom, and do any daily tasks. Today, when her home health nurse came to evaluate her, she sent over to the ER for further evaluation. The patient does complain of some occasional nausea and vomiting episodically, but no abdominal pain. No documented fever. No chills. No chest pain. No palpitations. No true syncope. No numbness. No tingling. The patient presented to the ER and was noted to have an abnormal urinalysis consistent with a UTI. The patient was also noted to be hypotensive, with systolic blood pressures in the 80s. We attempted to do orthostatics, but she was too dizzy and we could not record a blood pressure. She was likely noted to be orthostatic. Hospitalist services were contacted for further treatment and evaluation.   REVIEW OF SYSTEMS:  GENERAL:  No documented fever. Positive generalized weakness. No weight gain, no weight loss.  EYES: No blurry or double vision.  ENT: No tinnitus or postnasal drip. No redness of the oropharynx.  RESPIRATORY: No cough, no wheeze, no hemoptysis, no dyspnea.  CARDIOVASCULAR: No chest pain, no orthopnea, no palpitations, no syncope.  GASTROINTESTINAL: No nausea, no vomiting, no diarrhea. No abdominal pain. No melena or hematochezia.   GENITOURINARY: No dysuria or hematuria.  ENDOCRINE: No polyuria or nocturia. No heat or cold intolerance.  HEMATOLOGIC: No anemia, no bruising, no bleeding.  INTEGUMENT: No rashes. No lesions.  MUSCULOSKELETAL: No arthritis, no swelling, no gout.  NEUROLOGIC: No numbness or tingling. No ataxia. No seizure activity.   PSYCHIATRIC: Positive anxiety. Positive depression. No ADD.   PAST MEDICAL HISTORY: Consistent with COPD, oxygen dependent, history of hypertension, gout, coronary artery disease, GERD, hyperlipidemia, history of pulmonary embolism, chronic anticoagulation, Parkinsonian tremor, urinary incontinence, anxiety.   ALLERGIES: To multiple medications, including CODEINE, CONTRAST DYE, PREDNISONE, THEOPHYLLINE, LEVAQUIN, MORPHINE, PENICILLIN, REGLAN, TETRACYCLINE, MINOCIN. Most of these cause GI distress or hives. None of them are noted to cause anaphylaxis.   SOCIAL HISTORY: Used to be a smoker. Does have a 40 pack-year smoking history. No alcohol abuse. No illicit drug abuse. Lives at home with her husband.   FAMILY HISTORY: Both mother and father are deceased. Mother died from old age, father died from melanoma.   CURRENT MEDICATIONS ARE AS FOLLOWS:  Lexapro 10 mg b.i.d., Tylenol with hydrocodone 10/325, 1 tab 4 times daily as needed, aspirin 81 mg daily, Nexium 40 mg daily, Imdur 30 mg twice daily, Ranexa 1000 mg b.i.d., Toviaz 8 mg daily, Daliresp 500 mg daily, iron sulfate 325 mg b.i.d., allopurinol 3 mg daily, carbidopa, levodopa 50/200, 1 tab 4 times daily, metoprolol tartrate 25 mg b.i.d., Lasix 40 mg daily as needed, Singular 10 mg daily, loratadine 10 mg daily, Zofran 4 mg 4 times daily as needed, nabumetone 750 mg b.i.d., warfarin 6 mg daily, lorazepam  1 mg b.i.d., atorvastatin 20 mg at bedtime, Fosamax 70 mg weekly, trazodone 200 mg at bedtime, Ambien 10 mg at bedtime, fish oil 1000 mg daily, Spiriva 1 puff daily, Advair 500/50, 1 puff b.i.d., albuterol inhaler 2 puffs q.i.d. as  needed.   PHYSICAL EXAMINATION PRESENTLY IS AS FOLLOWS:  VITAL SIGNS are noted to be temperature 98.1, pulse 92, respirations 20, blood pressure 87/52, sats 95% on 2 liters nasal cannula.  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  HEAD, EYES, EARS, NOSE, THROAT EXAM:  Atraumatic, normocephalic. Extraocular muscles are intact. Pupils are equal and reactive to light. Sclerae anicteric. No conjunctival injection. No pharyngeal erythema.  NECK: Supple. There is no jugular venous distention. No bruits, no lymphadenopathy, no thyromegaly.  HEART: Regular rate and rhythm. No murmurs, no rubs, no clicks.  LUNGS: Clear to auscultation bilaterally. No rales, no rhonchi, no wheezes.  ABDOMEN: Soft, flat, nontender, nondistended. Has good bowel sounds. No hepatosplenomegaly appreciated.  EXTREMITIES: No evidence of any cyanosis, clubbing, or peripheral edema. Has +2 pedal and radial pulses bilaterally.  NEUROLOGICAL: The patient is alert, awake, oriented x 3, with no focal motor or sensory deficits appreciated bilaterally.  SKIN: Moist and warm, with no rashes appreciated.  LYMPHATIC: There is no cervical or axillary lymphadenopathy.   LABORATORY EXAM:  Showed a serum glucose of 84, BUN 16, creatinine 0.7, sodium 141, potassium 4.2, chloride 108, bicarb 28. LFTs showed an albumin 1.9 otherwise within normal limits. Troponin less than 0.02. White cell count 3.7, hemoglobin 8.7, hematocrit 25.4, platelet count 172. INR is 4.8. Urinalysis shows 8 white cells, 3+ bacteria. The patient did have a chest x-ray done, which showed no evidence of acute cardiopulmonary disease.   ASSESSMENT AND PLAN: This is a 69 year old female with a history of COPD, oxygen-dependent, hypertension, gout, history of coronary artery disease, GERD, hyperlipidemia, history of PE from chronic anticoagulation, Parkinsonian tremor, urinary incontinence, anxiety, who presents to the hospital due to weakness,  difficulty walking, and noted to be orthostatic and hypotensive and also tachycardic, with an abnormal urinalysis. The patient is likely here with SIRS due to UTI.    1.  Systemic inflammatory response syndrome. The patient presented with tachycardia, weakness and hypotension, and noted to have an abnormal urinalysis. Therefore, this is likely SIRS due to a ur tract infection. I will treat her with IV fluids, give her IV ceftriaxone for the UTI, follow hemodynamics, repeat orthostatics in the morning.   2.  Generalized weakness and difficulty walking. I suspect this is likely multifactorial related to underlying source and also deconditioning due to severe COPD, and a parkinsonian tremor. I will get a physical therapy evaluation to assess her mobility status. Continue IV antibiotics and IV fluids to treat the underlying SIRS.   3.  Hypertension. The patient is currently hypotensive and therefore hold all her antihypertensives for now.   4.  Gout. Continue with allopurinol. No acute gout attack.   5.  Parkinsonian tremor. Continue Sinemet.  6.  History of pulmonary embolism. The patient is already on warfarin, although will hold her Coumadin, as INR is supratherapeutic. Follow up PT-INR.  7.  Chronic obstructive pulmonary disease. No acute exacerbation. Continue Advair and Spiriva.  8.  Depression/anxiety. Continue Lexapro and Ativan.    9.  Urinary tract infection. Continue ceftriaxone and follow urine cultures.   The patient is a FULL CODE.   Time spent on admission is 60 minutes.   The patient will be transferred over  to Dr. Aletha HalimJeff Sparks' service.     ____________________________ Rolly PancakeVivek J. Cherlynn KaiserSainani, MD vjs:mr D: 02/22/2013 21:01:16 ET T: 02/22/2013 21:30:23 ET JOB#: [id number]  cc: Rolly PancakeVivek J. Cherlynn KaiserSainani, MD, <Dictator> Houston SirenVIVEK J SAINANI MD ELECTRONICALLY SIGNED 02/24/2013 19:08

## 2014-05-02 NOTE — Discharge Summary (Signed)
PATIENT NAME:  Ann Orozco, Ann Orozco MR#:  [id number] DATE OF BIRTH:  10/20/1945  DATE OF ADMISSION:  06/01/2013 DATE OF DISCHARGE:  06/03/2013  REASON FOR ADMISSION: Weakness and chest pain.   HISTORY OF PRESENT ILLNESS: Please see the dictated HPI done by Dr. Clint GuyHower on 05/29/2013.   PAST MEDICAL HISTORY:  1.  Chronic respiratory failure.  2.  Chronic obstructive pulmonary disease.  3.  Morbid obesity.  4.  Chronic nausea and vomiting.  5.  Obstructive sleep apnea, on CPAP.  6.  Parkinson's disease.  7.  Benign hypertension.  8.  Hyperlipidemia.  9.  History of pulmonary embolism.   MEDICATIONS ON ADMISSION: Please see admission note.   ALLERGIES: CODEINE, CONTRAST DYE, , IODINE, LEVAQUIN, MINOCIN, MORPHINE, PENICILLIN, PREDNISONE, REGLAN, TALWIN, TETRACYCLINE AND THEOPHYLLINE.   SOCIAL HISTORY, FAMILY HISTORY AND REVIEW OF SYSTEMS: As per admission note.   PHYSICAL EXAM:  GENERAL: The patient was chronically ill-appearing, in no acute distress.  VITAL SIGNS: Stable.  HEENT: Unremarkable.  NECK: Supple without JVD.  LUNGS: Revealed good respiratory effort with clear breath sounds.  CARDIAC: Regular rate and rhythm. Normal S1, S2.  ABDOMEN: Soft and nontender.  EXTREMITIES: Revealed 1+ edema.  NEUROLOGIC: Grossly nonfocal.   HOSPITAL COURSE: The patient was admitted with a UTI and weakness with failure to thrive. She was seen by palliative care. Placement was recommended, but she deferred. She continued to have issues with nausea and vomiting. She was seen by GI. GI workup revealed chronic esophageal dysmotility. She was seen by speech therapy and placed on a pureed diet. The patient deferred placement. Her respiratory status remained stable. She had no cardiac complications while in the hospital. She was subsequently discharged home on 05/26 to home with the care of hospice on 06/03/2013 for further care.    DISCHARGE DIAGNOSES:  1.  Urinary tract infection.  2.  Chronic  weakness.  3.  Chronic nausea.  4.  Dysphagia.  5.  Chronic obstructive pulmonary disease.  6.  Chronic respiratory failure.  7.  Morbid obesity.  8.  History of pulmonary embolism.   DISCHARGE MEDICATIONS:  1.  Lorazepam 1 mg p.o. b.i.d. 2.  Nitrostat 0.4 mg sublingually p.r.n. chest pain.  3.  Ranexa 1000 mg p.o. b.i.d.  4.  Lexapro 10 mg p.o. daily.  5.  Trazodone 200 mg p.o. at bedtime.  6.  Effexor-XR 75 mg p.o. daily.  7.  Lipitor 20 mg p.o. at bedtime.  8.  Sinemet 25/100, 1 p.o. q.6 hours. 9.  Aspirin 81 mg p.o. daily.  10. Seroquel 200 mg p.o. at bedtime.  11. Spiriva 1 capsule inhaled daily.  12. Singulair 10 mg p.o. daily.  13. Protonix 40 mg p.o. b.i.d.  14. Macrobid 100 mg p.o. b.i.d. 15. Toviaz 4 mg p.o. daily. 16. Relafen 750 mg p.o. b.i.d.  17. Coumadin 7.5 mg p.o. daily.  18. Verapamil 240 mg p.o. daily.  19. MiraLax 17 grams p.o. daily.  20. Norco 10/325, 1 p.o. q.6 hours p.r.n. pain.  21. Zofran 4 mg every 4 hours as needed.   FOLLOWUP PLANS AND APPOINTMENTS: The patient will be followed by hospice. She is on 3 L of oxygen. She will follow up with me in 1 to 2 weeks, sooner if needed.  ____________________________ Duane LopeJeffrey D. Judithann SheenSparks, MD jds:aw D: 06/13/2013 12:43:58 ET T: 06/13/2013 13:00:03 ET JOB#: [id number]  cc: Duane LopeJeffrey D. Judithann SheenSparks, MD, <Dictator> [place] Devin Rodena Medin Eugenia Eldredge MD ELECTRONICALLY SIGNED 06/13/2013 22:18

## 2014-05-02 NOTE — H&P (Signed)
PATIENT NAME:  [name], Ann M MR#:  [id number] DATE OF BIRTH:  02-09-1945  DATE OF ADMISSION:  06/11/2013  ADDENDUM  The patient actually is not taking a lot of her previous medications.  The husband is not quite sure which ones she does take and which ones she does not, but checking with her she is not taking any of her heart pills and she is not taking warfarin anymore.  We are going to take her off warfarin and put her on prophylactic heparin.    ____________________________ Felipa Furnaceoberto Sanchez Gutierrez, MD rsg:ea D: 06/11/2013 01:22:29 ET T: 06/11/2013 02:03:09 ET JOB#: [id number]  cc: Felipa Furnaceoberto Sanchez Gutierrez, MD, <Dictator> Adabella Stanis Juanda ChanceSANCHEZ GUTIERRE MD ELECTRONICALLY SIGNED 06/15/2013 19:32

## 2014-05-02 NOTE — Consult Note (Signed)
History of Present Illness:  Reason for Consult Pancytopenia.   HPI   Patient is a 69 year old female with multiple medical problems who was admitted with complaints of generalized weakness and difficulty ambulating. She also reports a fall several days prior to admission. In the emergency room, she was found to be hypotensive with a UTI.  She has no neurologic complaints. She has a poor appetite, but denies weight loss. She denies any fevers. She denies any chest pain, but is chronically short of breath.  she has occasional nausea, but denies any vomiting, constipation, or diarrhea. She denies any bleeding. Patient otherwise feels well and offers no further specific complaints.  PFSH:  Additional Past Medical and Surgical History oxygen-dependent COPD, hypertension, gout, CAD, GERD, hyperlipidemia, PE on chronic anticoagulation, tremor, anxiety.  Family history: Father with melanoma.  Social history: Positive tobacco, 40-pack-year history. Denies alcohol.   Review of Systems:  Performance Status (ECOG) 2   Review of Systems   As per HPI. Otherwise, 10 point system review was negative.   NURSING NOTES: **Vital Signs.:   16-Feb-15 14:49   Vital Signs Type: Routine   Temperature Temperature (F): 98.3   Celsius: 36.8   Pulse Pulse: 117   Respirations Respirations: 17   Systolic BP Systolic BP: 496   Diastolic BP (mmHg) Diastolic BP (mmHg): 73   Mean BP: 86   Pulse Ox % Pulse Ox %: 97   Pulse Ox Activity Level: At rest   Oxygen Delivery: 3L   Physical Exam:  Physical Exam General: morbidly obese, no acute distress. Eyes: Pink conjunctiva, anicteric sclera. Lungs: Clear to auscultation bilaterally. Heart: Regular rate and rhythm. No rubs, murmurs, or gallops. Abdomen: Soft, obese, normoactive bowel sounds. Musculoskeletal: No edema, cyanosis, or clubbing. Neuro: Alert, answering all questions appropriately. Cranial nerves grossly intact. Skin: No rashes or  petechiae noted. Psych: Normal affect.    Theophylline: Alt Ment Status  Prednisone: Alt Ment Status  Levaquin: Hives  Morphine: Itching  Reglan: Hives  Talwin NX: GI Distress  Codeine: Hives  Tetracycline: Hives  Iodine Strong: Unknown  Minocin: Unknown  Penicillin: Rash, Itching  Contrast - Iodinated Radiocontrast Dye: Other  Deconsal II: Unknown    atorvastatin 20 mg oral tablet: 1 tab(s) orally once a day (at bedtime), Status: Active, Quantity: 0, Refills: None   isosorbide mononitrate 120 mg oral tablet, extended release: 1 tab(s) orally 2 times a day, Status: Active, Quantity: 0, Refills: None   ranolazine 1000 mg oral tablet, extended release: 1 tab(s) orally 2 times a day, Status: Active, Quantity: 0, Refills: None   Daliresp 500 mcg oral tablet: 1 tab(s) orally once a day, Status: Active, Quantity: 0, Refills: None   acetaminophen-HYDROcodone 325 mg-10 mg oral tablet: 1 tab(s) orally every 4 hours, Status: Active, Quantity: 0, Refills: None   traZODone 100 mg oral tablet: 2 tabs (279m) orally once a day (at bedtime), Status: Active, Quantity: 0, Refills: None   metoprolol tartrate 25 mg oral tablet: 1 tab(s) orally 2 times a day, Status: Active, Quantity: 0, Refills: None   aspirin 81 mg oral delayed release tablet: 1 tab(s) orally once a day, Status: Active, Quantity: 0, Refills: None   allopurinol 300 mg oral tablet: 1 tab(s) orally once a day, Status: Active, Quantity: 0, Refills: None   montelukast 10 mg oral tablet: 1 tab(s) orally once a day (in the evening), Status: Active, Quantity: 0, Refills: None   Toviaz 8 mg oral tablet, extended release: 1 tab(s) orally once a  day, Status: Active, Quantity: 0, Refills: None   escitalopram 10 mg oral tablet: 1 tab(s) orally 2 times a day, Status: Active, Quantity: 0, Refills: None   NexIUM 40 mg oral delayed release capsule: 1 cap(s) orally once a day, Status: Active, Quantity: 0, Refills: None   ferrous sulfate 325  mg oral tablet: 1 tab(s) orally 2 times a day, Status: Active, Quantity: 0, Refills: None   carbidopa-levodopa 50 mg-200 mg oral tablet, extended release: 1 tab(s) orally 4 times a day, Status: Active, Quantity: 0, Refills: None   furosemide 40 mg oral tablet: 1 tab(s) orally once a day, As Needed, Status: Active, Quantity: 0, Refills: None   loratadine 10 mg oral tablet: 1 tab(s) orally once a day, Status: Active, Quantity: 0, Refills: None   ondansetron 4 mg oral tablet: 1 tab(s) orally 4 times a day, Status: Active, Quantity: 0, Refills: None   nabumetone 750 mg oral tablet: 1 tab(s) orally 2 times a day, Status: Active, Quantity: 0, Refills: None   warfarin 6 mg oral tablet: 1 tab(s) orally once a day, Status: Active, Quantity: 0, Refills: None   LORazepam 1 mg oral tablet: 1 tab(s) orally 2 times a day, Status: Active, Quantity: 0, Refills: None   alendronate 70 mg oral tablet: 1 tab(s) orally once a week on Wednesday, Status: Active, Quantity: 0, Refills: None   zolpidem 10 mg oral tablet: 1 tab(s) orally once a day (at bedtime), Status: Active, Quantity: 0, Refills: None   Fish Oil 1000 mg oral capsule: 1 cap(s) orally once a day, Status: Active, Quantity: 0, Refills: None   Spiriva 18 mcg inhalation capsule: 1 cap(s) via handihaler once a day, Status: Active, Quantity: 0, Refills: None   Advair Diskus 500 mcg-50 mcg inhalation powder: 1 puff(s) inhaled 2 times a day *rinse mouth*, Status: Active, Quantity: 0, Refills: None   ProAir HFA CFC free 90 mcg/inh inhalation aerosol: 2 puff(s) inhaled 2 times a day and as needed, Status: Active, Quantity: 0, Refills: None   aluminum hydroxide/magnesium/simethicone 200mg-200mg-20mg/5ml oral suspension: 15 milliliter(s) orally 4 times a day, Status: Active, Quantity: 0, Refills: None   nitroglycerin 0.4 mg sublingual tablet: 1 tab(s) sublingual every 5 minutes up to 3 doses as needed for chest pain. *if no relief call 911 or go to  emergency room*, Status: Active, Quantity: 0, Refills: None   polyethylene glycol 3350 - oral powder for reconstitution: 1 capful (17 grams) in 8oz of fluid and drink orally 2 times a day as needed for constipation, Status: Active, Quantity: 0, Refills: None   fluticasone nasal 50 mcg/inh nasal spray: 2 spray(s) in each nostril once a day, Status: Active, Quantity: 0, Refills: None   QUEtiapine 200 mg oral tablet: 1 tab orally once a day (in the evening) at 7pm., Status: Active, Quantity: 0, Refills: None  Laboratory Results: Routine BB:  16-Feb-15 08:03   ABO Group + Rh Type O Positive  Antibody Screen NEGATIVE (Result(s) reported on 24 Feb 2013 at 09:20AM.)  Routine Chem:  16-Feb-15 08:03   Result Comment WBC COUNT - RESULTS VERIFIED BY REPEAT TESTING.  - NOTIFIED OF CRITICAL VALUE  - [facility] C/ [facility] [place] @ 0832 02/24/13  - READ-BACK PROCESS PERFORMED.  Result(s) reported on 24 Feb 2013 at 08:36AM.  Iron Binding Capacity (TIBC)  142  Unbound Iron Binding Capacity 97  Iron, Serum  45  Iron Saturation 32 (Result(s) reported on 24 Feb 2013 at 12:20PM.)  Routine Hem:  16-Feb-15 08:03   WBC (CBC)    1.9  RBC (CBC)  2.24  Hemoglobin (CBC)  8.2  Hematocrit (CBC)  24.8  Platelet Count (CBC)  135  MCV  111  MCH  36.6  MCHC 33.1  RDW  15.4  Neutrophil % 59.8  Lymphocyte % 31.4  Monocyte % 6.6  Eosinophil % 1.4  Basophil % 0.8  Neutrophil #  1.1  Lymphocyte #  0.6  Monocyte #  0.1  Eosinophil # 0.0  Basophil # 0.0 (Result(s) reported on 24 Feb 2013 at 08:36AM.)   Assessment and Plan: Impression:   Pancytopenia. Plan:   1. Pancytopenia: I suspect her mild thrombocytopenia and leukopenia are secondary to her acute illness or possibly medication related. Continue to monitor daily CBC. Her hemoglobin was also decreased and iron stores are slightly low, therefore she will receive 510 mg IV Feraheme today. B12, folate, and LDH have been ordered for completeness. It is possible patient  has underlying MDS, but bone marrow biopsy is not necessary at this time. Plus given her body habitus, this would be technically difficult procedure. consult, Dr. Ramiah will continue to follow in the morning.  Electronic Signatures: Finnegan, Timothy (MD)  (Signed 16-Feb-15 18:00)  Authored: HISTORY OF PRESENT ILLNESS, PFSH, ROS, NURSING NOTES, PE, ALLERGIES, HOME MEDICATIONS, LABS, ASSESSMENT AND PLAN   Last Updated: 16-Feb-15 18:00 by Finnegan, Timothy (MD) 

## 2014-08-29 IMAGING — CT CT HEAD WITHOUT CONTRAST
2 series · 14 of 30 positions shown, 16 images · non-contrast
Comparison: November 22, 2011

CLINICAL DATA: Recent trauma; altered mental state

EXAM:
CT HEAD WITHOUT CONTRAST
TECHNIQUE: Contiguous axial images were obtained from the base of the skull
through the vertex without intravenous contrast.

[Series 2: head wo · axial · 0.40mm/px · z∈[-88,+11]mm · 6 of 32 slices shown, 8 images]
[im 5/32  brain]
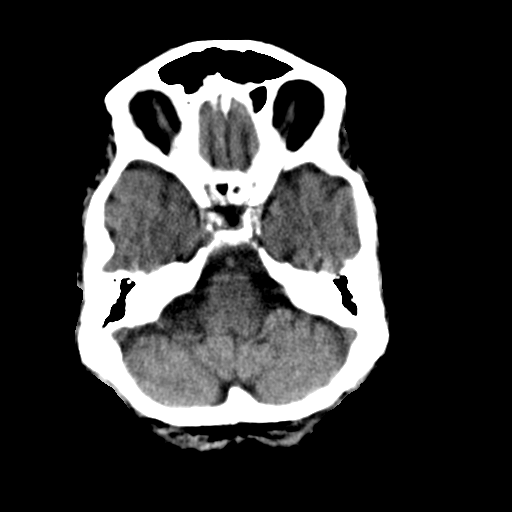
[im 5/32  bone]
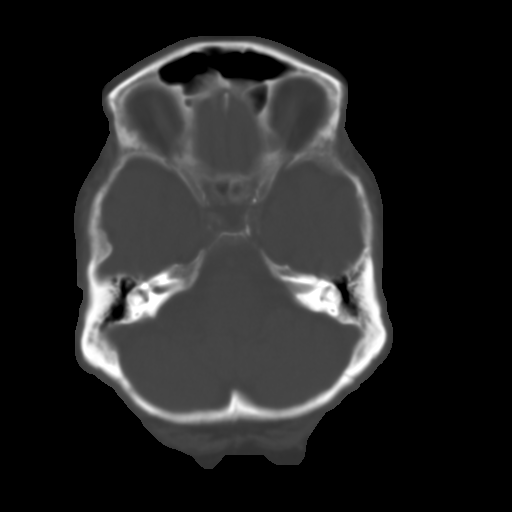
[im 9/32  brain]
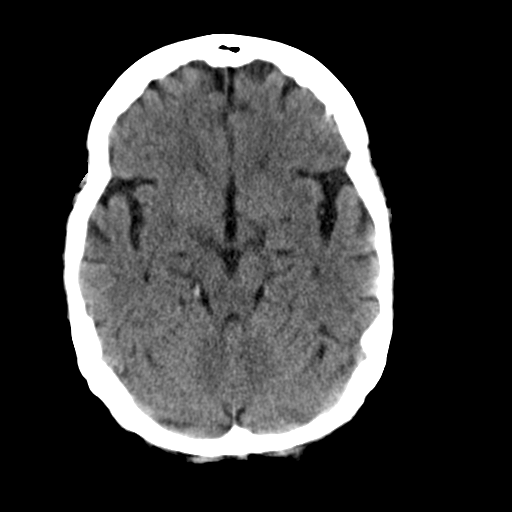
[im 14/32  brain]
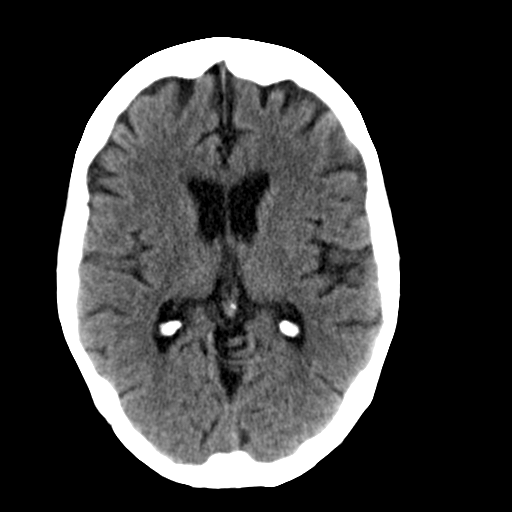
[im 18/32  brain]
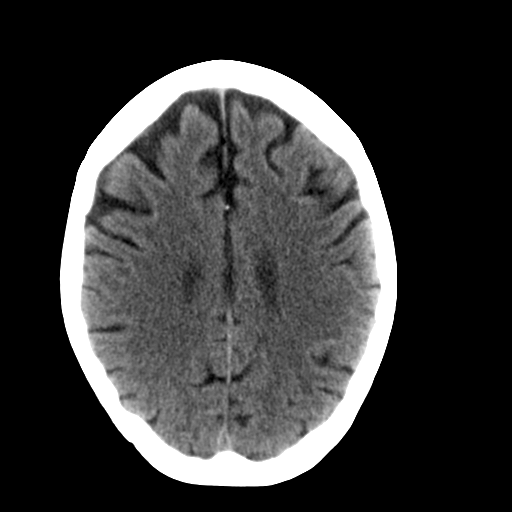
[im 23/32  brain]
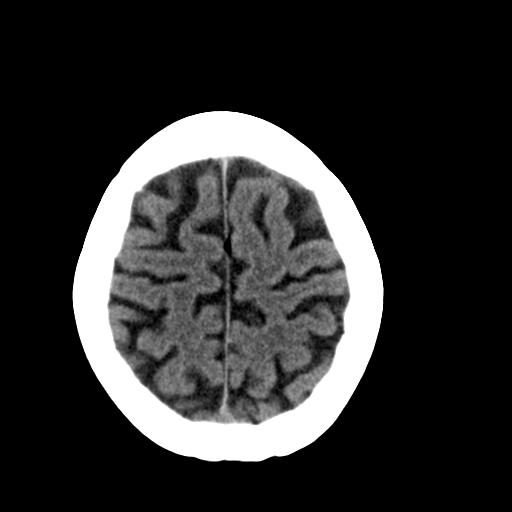
[im 23/32  bone]
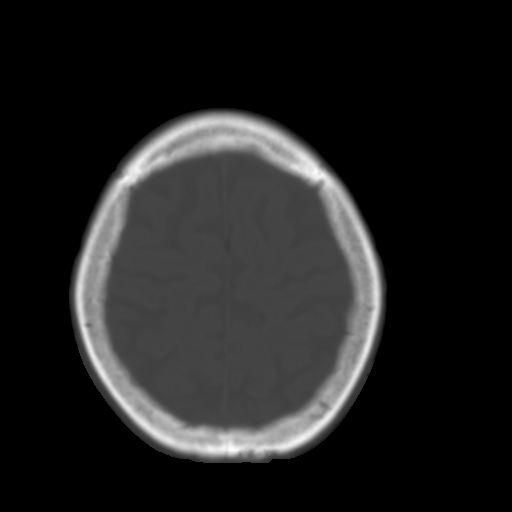
[im 27/32  brain]
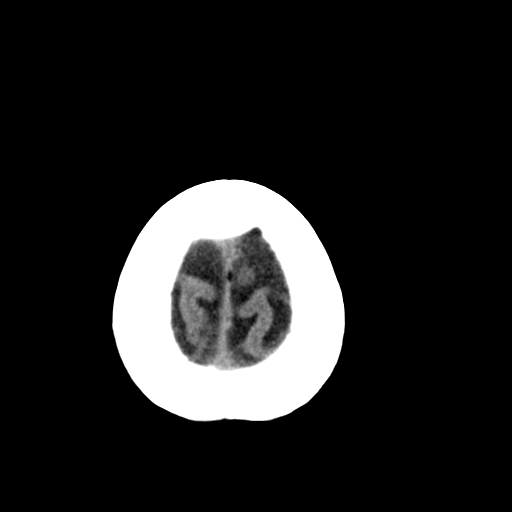

[Series 3: head bone · axial · 0.40mm/px · z∈[-94,+20]mm · 8 of 96 slices shown]
[im 10/96  bone]
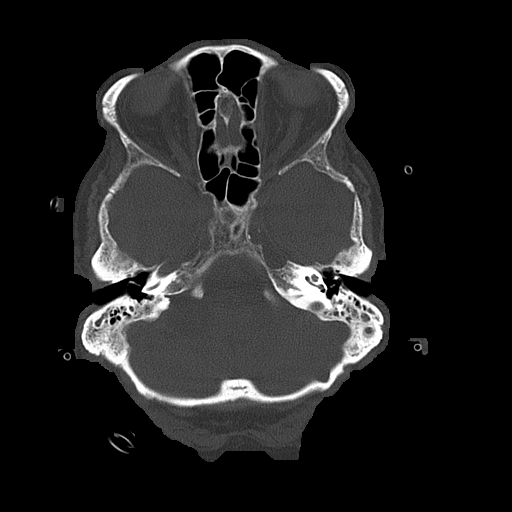
[im 19/96  bone]
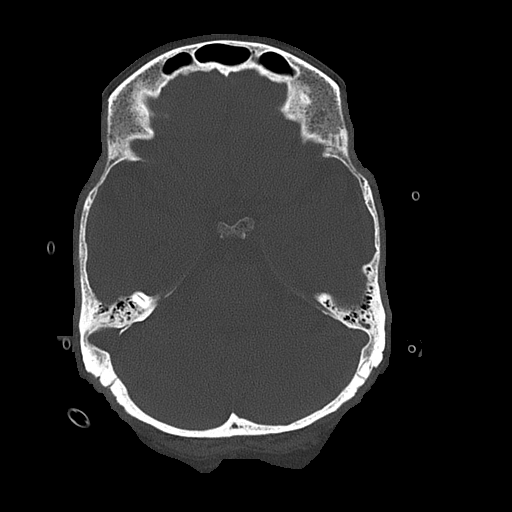
[im 32/96  bone]
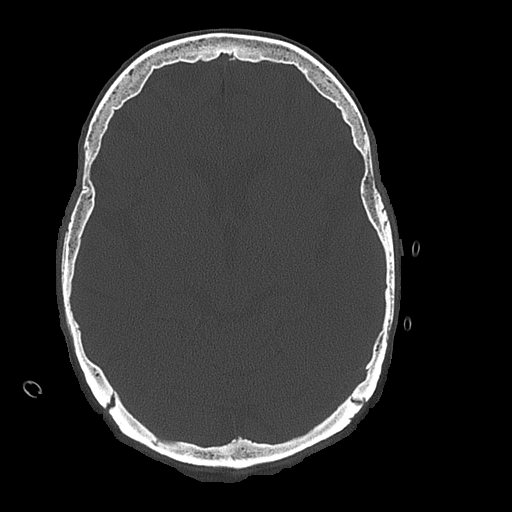
[im 41/96  bone]
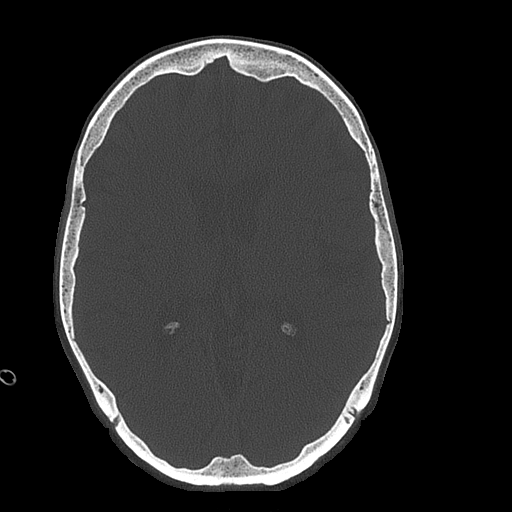
[im 55/96  bone]
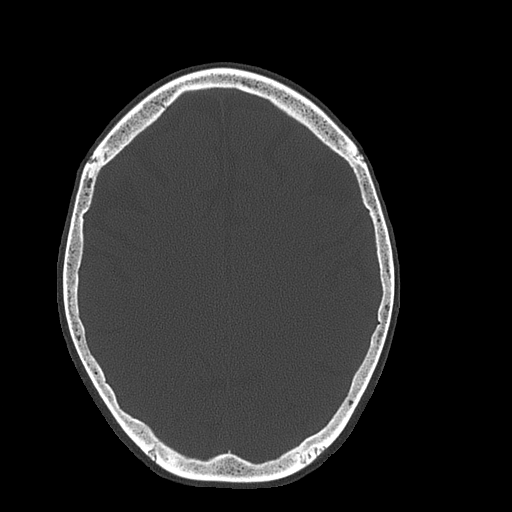
[im 64/96  bone]
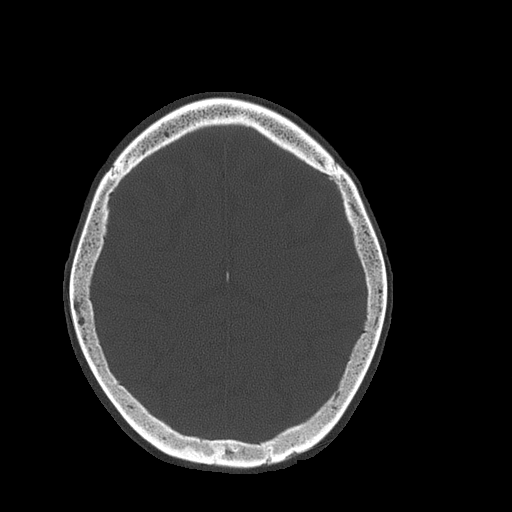
[im 77/96  bone]
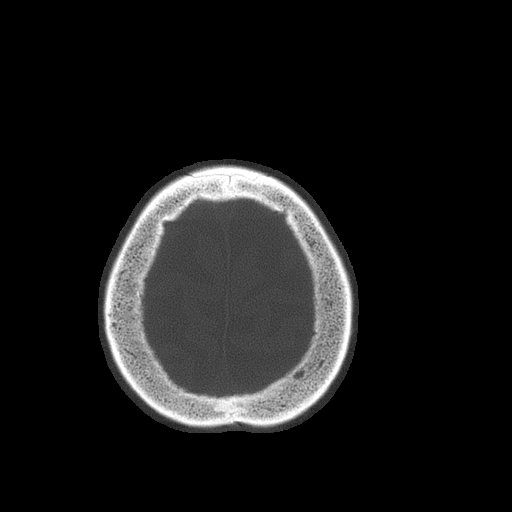
[im 86/96  bone]
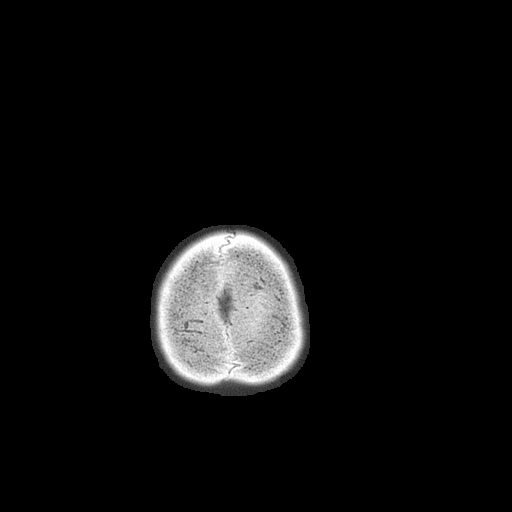

[14 of 30 positions shown; findings below may reference images not displayed]

FINDINGS: There is mild diffuse atrophy. There is no mass, hemorrhage,
extra-axial fluid collection, or midline shift. There is slight
small vessel disease in the centra semiovale bilaterally. Elsewhere
gray-white compartments appear normal. Bony calvarium appears
intact. Mastoid air cells on the right are clear. There is mild
thickening of several mastoids on the left, stable.
IMPRESSION: Mild atrophy with mild periventricular small vessel disease. No
intracranial mass, hemorrhage, or acute infarct. Mild stable
left-sided mastoid disease. Mastoids on the right are clear.
